# Patient Record
Sex: Female | Born: 1950 | Race: White | Hispanic: No | Smoking: Never smoker
Health system: Southern US, Community
[De-identification: ages and names within clinical notes are randomized; demographics above are authoritative.]

## PROBLEM LIST (undated history)

## (undated) DIAGNOSIS — F40240 Claustrophobia: Secondary | ICD-10-CM

## (undated) DIAGNOSIS — K5792 Diverticulitis of intestine, part unspecified, without perforation or abscess without bleeding: Secondary | ICD-10-CM

## (undated) DIAGNOSIS — F99 Mental disorder, not otherwise specified: Secondary | ICD-10-CM

## (undated) DIAGNOSIS — F319 Bipolar disorder, unspecified: Secondary | ICD-10-CM

## (undated) DIAGNOSIS — K589 Irritable bowel syndrome without diarrhea: Secondary | ICD-10-CM

## (undated) DIAGNOSIS — R569 Unspecified convulsions: Secondary | ICD-10-CM

## (undated) DIAGNOSIS — G2581 Restless legs syndrome: Secondary | ICD-10-CM

## (undated) DIAGNOSIS — K5900 Constipation, unspecified: Secondary | ICD-10-CM

## (undated) DIAGNOSIS — F419 Anxiety disorder, unspecified: Secondary | ICD-10-CM

## (undated) DIAGNOSIS — M4727 Other spondylosis with radiculopathy, lumbosacral region: Secondary | ICD-10-CM

## (undated) DIAGNOSIS — R251 Tremor, unspecified: Secondary | ICD-10-CM

## (undated) DIAGNOSIS — R42 Dizziness and giddiness: Secondary | ICD-10-CM

## (undated) DIAGNOSIS — I10 Essential (primary) hypertension: Secondary | ICD-10-CM

## (undated) DIAGNOSIS — G43019 Migraine without aura, intractable, without status migrainosus: Secondary | ICD-10-CM

## (undated) DIAGNOSIS — K760 Fatty (change of) liver, not elsewhere classified: Secondary | ICD-10-CM

## (undated) DIAGNOSIS — K219 Gastro-esophageal reflux disease without esophagitis: Secondary | ICD-10-CM

## (undated) DIAGNOSIS — F32A Depression, unspecified: Secondary | ICD-10-CM

## (undated) DIAGNOSIS — R51 Headache: Secondary | ICD-10-CM

## (undated) DIAGNOSIS — G250 Essential tremor: Secondary | ICD-10-CM

## (undated) DIAGNOSIS — H532 Diplopia: Principal | ICD-10-CM

## (undated) DIAGNOSIS — R269 Unspecified abnormalities of gait and mobility: Secondary | ICD-10-CM

## (undated) DIAGNOSIS — F329 Major depressive disorder, single episode, unspecified: Secondary | ICD-10-CM

## (undated) DIAGNOSIS — M1990 Unspecified osteoarthritis, unspecified site: Secondary | ICD-10-CM

## (undated) HISTORY — PX: BLADDER SUSPENSION: SHX72

## (undated) HISTORY — DX: Diplopia: H53.2

## (undated) HISTORY — DX: Essential tremor: G25.0

## (undated) HISTORY — PX: JOINT REPLACEMENT: SHX530

## (undated) HISTORY — DX: Unspecified abnormalities of gait and mobility: R26.9

## (undated) HISTORY — PX: ABDOMINAL HYSTERECTOMY: SHX81

## (undated) HISTORY — PX: EYE SURGERY: SHX253

## (undated) HISTORY — DX: Migraine without aura, intractable, without status migrainosus: G43.019

## (undated) HISTORY — PX: COLONOSCOPY W/ POLYPECTOMY: SHX1380

---

## 2005-01-21 ENCOUNTER — Encounter: Payer: Self-pay | Admitting: Internal Medicine

## 2005-05-18 ENCOUNTER — Emergency Department (HOSPITAL_COMMUNITY): Admission: EM | Admit: 2005-05-18 | Discharge: 2005-05-18 | Payer: Self-pay | Admitting: Emergency Medicine

## 2005-05-20 ENCOUNTER — Emergency Department (HOSPITAL_COMMUNITY): Admission: EM | Admit: 2005-05-20 | Discharge: 2005-05-20 | Payer: Self-pay | Admitting: Family Medicine

## 2005-05-23 ENCOUNTER — Ambulatory Visit: Payer: Self-pay | Admitting: Internal Medicine

## 2005-05-24 ENCOUNTER — Other Ambulatory Visit (HOSPITAL_COMMUNITY): Admission: RE | Admit: 2005-05-24 | Discharge: 2005-06-14 | Payer: Self-pay | Admitting: Psychiatry

## 2005-05-24 ENCOUNTER — Ambulatory Visit: Payer: Self-pay | Admitting: Psychiatry

## 2005-05-30 ENCOUNTER — Ambulatory Visit: Payer: Self-pay | Admitting: Internal Medicine

## 2005-08-04 ENCOUNTER — Ambulatory Visit: Payer: Self-pay | Admitting: Internal Medicine

## 2005-08-10 ENCOUNTER — Ambulatory Visit: Payer: Self-pay | Admitting: Internal Medicine

## 2005-08-23 ENCOUNTER — Ambulatory Visit: Payer: Self-pay | Admitting: Internal Medicine

## 2005-08-23 ENCOUNTER — Encounter: Payer: Self-pay | Admitting: Internal Medicine

## 2005-08-25 ENCOUNTER — Ambulatory Visit: Payer: Self-pay

## 2005-08-25 ENCOUNTER — Encounter: Payer: Self-pay | Admitting: Cardiology

## 2005-09-02 ENCOUNTER — Emergency Department (HOSPITAL_COMMUNITY): Admission: EM | Admit: 2005-09-02 | Discharge: 2005-09-02 | Payer: Self-pay | Admitting: Emergency Medicine

## 2005-09-28 ENCOUNTER — Ambulatory Visit: Payer: Self-pay | Admitting: Internal Medicine

## 2005-09-30 ENCOUNTER — Ambulatory Visit: Payer: Self-pay | Admitting: Internal Medicine

## 2006-10-11 ENCOUNTER — Encounter: Payer: Self-pay | Admitting: Internal Medicine

## 2006-10-11 DIAGNOSIS — E785 Hyperlipidemia, unspecified: Secondary | ICD-10-CM

## 2006-10-11 DIAGNOSIS — Z8719 Personal history of other diseases of the digestive system: Secondary | ICD-10-CM

## 2006-10-11 DIAGNOSIS — K589 Irritable bowel syndrome without diarrhea: Secondary | ICD-10-CM

## 2006-10-11 DIAGNOSIS — F329 Major depressive disorder, single episode, unspecified: Secondary | ICD-10-CM

## 2007-02-22 HISTORY — PX: BACK SURGERY: SHX140

## 2009-01-13 ENCOUNTER — Encounter: Payer: Self-pay | Admitting: Internal Medicine

## 2009-02-03 ENCOUNTER — Telehealth: Payer: Self-pay | Admitting: Internal Medicine

## 2009-03-26 ENCOUNTER — Encounter (INDEPENDENT_AMBULATORY_CARE_PROVIDER_SITE_OTHER): Payer: Self-pay | Admitting: Neurosurgery

## 2009-03-26 ENCOUNTER — Ambulatory Visit (HOSPITAL_COMMUNITY): Admission: RE | Admit: 2009-03-26 | Discharge: 2009-03-27 | Payer: Self-pay | Admitting: Neurosurgery

## 2009-06-05 ENCOUNTER — Encounter (INDEPENDENT_AMBULATORY_CARE_PROVIDER_SITE_OTHER): Payer: Self-pay | Admitting: *Deleted

## 2009-06-14 ENCOUNTER — Emergency Department (HOSPITAL_COMMUNITY): Admission: EM | Admit: 2009-06-14 | Discharge: 2009-06-14 | Payer: Self-pay | Admitting: Emergency Medicine

## 2009-07-23 DIAGNOSIS — K219 Gastro-esophageal reflux disease without esophagitis: Secondary | ICD-10-CM | POA: Insufficient documentation

## 2009-07-23 DIAGNOSIS — K5730 Diverticulosis of large intestine without perforation or abscess without bleeding: Secondary | ICD-10-CM | POA: Insufficient documentation

## 2009-07-23 DIAGNOSIS — F411 Generalized anxiety disorder: Secondary | ICD-10-CM | POA: Insufficient documentation

## 2009-07-29 ENCOUNTER — Ambulatory Visit: Payer: Self-pay | Admitting: Internal Medicine

## 2009-07-29 DIAGNOSIS — G43909 Migraine, unspecified, not intractable, without status migrainosus: Secondary | ICD-10-CM | POA: Insufficient documentation

## 2009-07-29 DIAGNOSIS — H409 Unspecified glaucoma: Secondary | ICD-10-CM | POA: Insufficient documentation

## 2009-07-29 DIAGNOSIS — R42 Dizziness and giddiness: Secondary | ICD-10-CM | POA: Insufficient documentation

## 2009-07-29 DIAGNOSIS — N390 Urinary tract infection, site not specified: Secondary | ICD-10-CM

## 2009-07-29 DIAGNOSIS — I10 Essential (primary) hypertension: Secondary | ICD-10-CM | POA: Insufficient documentation

## 2009-07-30 ENCOUNTER — Telehealth: Payer: Self-pay | Admitting: Internal Medicine

## 2009-07-30 DIAGNOSIS — R142 Eructation: Secondary | ICD-10-CM

## 2009-07-30 DIAGNOSIS — R143 Flatulence: Secondary | ICD-10-CM

## 2009-07-30 DIAGNOSIS — R141 Gas pain: Secondary | ICD-10-CM | POA: Insufficient documentation

## 2009-08-05 ENCOUNTER — Telehealth: Payer: Self-pay | Admitting: Internal Medicine

## 2009-10-16 ENCOUNTER — Telehealth: Payer: Self-pay | Admitting: Internal Medicine

## 2010-03-14 ENCOUNTER — Encounter: Payer: Self-pay | Admitting: Internal Medicine

## 2010-03-23 NOTE — Assessment & Plan Note (Signed)
Summary: Gastroenterology  Robin Collier MR#:  [id number] Page #  NAME:  Robin Collier, Robin Collier  OFFICE NO:  [id number]  DATE:  05/23/05  DOB:  05/03/2050  HISTORY OF PRESENT ILLNESS:  The patient is a 60 year old white female who is here today for evaluation of gastroesophageal reflux disease with dysphagia to solids and liquids and also for a problem of constipation and left lower quadrant abdominal pain.  We saw the patient in 1997 for change in the bowel habits, occasional rectal bleeding.  Her colonoscopy was normal except for mild diverticulosis of the left colon.  She also had an upper endoscopy because of right upper quadrant abdominal pain.  The exam was normal, ultrasound of the gallbladder was normal.  She has had severe chronic depression for the past 10 years and has been on multiple medications.  She moved away to [place] where she was cared for by a gastroenterologist who told her that she may need anti-reflux procedure.  She has dysphagia to pills as well as to liquids and solids.  She has no odynophagia.  Her mouth has been dry.  Last endoscopy was approximately 1 year ago in [place].  We do not have those records.  She also had a colonoscopy since then and was told that she needed a repeat exam last fall.   MEDICATIONS:  Trazodone 100 mg q. HS, ______ q.d., Cymbalta 60 mg q.d, Aciphex 20 mg Collier.o. q.d., ranitidine 300 mg q. HS, Zelnorm 6 mg Collier.o. b.i.d., Lamictal 200 mg Collier.o. q.d., Wellbutrin XL 150 mg Collier.o. q.d., and Nasacort.  PAST HISTORY:  Significant for high cholesterol, anxiety, depression.     OPERATIONS:  Hysterectomy, back surgery.    FAMILY HISTORY:  Father had diabetes.  Mother, ovarian cancer.  Father, heart disease.    SOCIAL HISTORY:  She is single.  She worked for [employer], retired due to disability.  She has 2 children.  She does not smoke, does not drink.  REVIEW OF SYSTEMS:  Positive for eyeglasses, frequent cough, skin rashes, excessive thirst, dry mouth,  night sweats, vision changes, back pain, severe fatigue, shortness of breath, and confusion.  PHYSICAL EXAMINATION:  Blood pressure 124/62.  Pulse 78.  Weight 178 pounds.  Patient was very quiet and pleasant but completely flat affect.  She could hardly talk.  Sclerae is nonicteric.  Oral cavity was normal.  Neck was supple without adenopathy.  Lungs were clear to auscultation.  COR with normal S1, normal S2.  Abdomen was soft, relaxed, nontender with normal active bowel sounds. Normal epigastrium.  Liver edge at costal margin.  Rectal exam with normal rectal tone, stool was hemoccult negative. Extremities:  No edema.  IMPRESSION:   2.   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  She has a history of gastroesophageal reflux and it is possible that she may have developed esophageal stricture although on my last endoscopy 10 years ago there was no stricture.  I would tend to think that her swallowing problems are related to motility rather than to structural obstruction.   2.   Left lower quadrant abdominal pain most likely related to symptomatic diverticulosis or due to functional constipation or due to irritable bowel syndrome.  This needs to be further investigated since she is over 62 years old.   There is no occult gastrointestinal blood loss.    PLAN: 1.  Barium swallow with cine-esophagram to assess the motility. 2.   Continue Aciphex and ranitidine. 3.   Discuss with her psychiatrist reduction of some of her psychotropic medications to improve her swallowing problem. 4.   Colonoscopy scheduled using routine colonoscopy prep.  Depending on the findings, she may need anti-spasmodic so increase fiber in her diet.         Hedwig Morton. Juanda Chance, M.D.  [id number] cc:  Dr. Byrd Hesselbach, [facility] D:  05/23/05; T:   ; Job [phone]

## 2010-03-23 NOTE — Letter (Signed)
Summary: New Patient letter  [facility]  [address] [place], [place] [address]   Phone: [phone]  Fax: [phone]       06/05/2009 MRN: [id number]  Robin Collier [address] [place], [place]  [address]  Dear Ms. Denis,  Welcome to the Gastroenterology Division at [facility].    You are scheduled to see Dr.  Juanda Chance on 07-29-09 at 10:30a.m. on the 3rd floor at [facility], [address]  We ask that you try to arrive at our office 15 minutes prior to your appointment time to allow for check-in.  We would like you to complete the enclosed self-administered evaluation form prior to your visit and bring it with you on the day of your appointment.  We will review it with you.  Also, please bring a complete list of all your medications or, if you prefer, bring the medication bottles and we will list them.  Please bring your insurance card so that we may make a copy of it.  If your insurance requires a referral to see a specialist, please bring your referral form from your primary care physician.  Co-payments are due at the time of your visit and may be paid by cash, check or credit card.     Your office visit will consist of a consult with your physician (includes a physical exam), any laboratory testing he/she may order, scheduling of any necessary diagnostic testing (e.g. x-ray, ultrasound, CT-scan), and scheduling of a procedure (e.g. Endoscopy, Colonoscopy) if required.  Please allow enough time on your schedule to allow for any/all of these possibilities.    If you cannot keep your appointment, please call [phone] to cancel or reschedule prior to your appointment date.  This allows [name] the opportunity to schedule an appointment for another patient in need of care.  If you do not cancel or reschedule by 5 p.m. the business day prior to your appointment date, you will be charged a $50.00 late cancellation/no-show fee.    Thank you for  choosing [facility] for your medical needs.  We appreciate the opportunity to care for you.  Please visit [name] at our website  to learn more about our practice.                     Sincerely,                                                             The Gastroenterology Division

## 2010-03-23 NOTE — Progress Notes (Signed)
Summary: Discuss a gi problem   Phone Note Call from Patient Call back at [phone]   Call For: Dr Juanda Chance Reason for Call: Talk to Nurse Summary of Call: Wants to discuss a previous gi problems she had. Initial call taken by: Leanor Kail [facility],  October 16, 2009 11:01 AM  Follow-up for Phone Call        Patient  c/o right side pain, that radiates around to her back an then down to her feet.  Patient  denies any other complaints.  Patient  advised she should check with her orthopedic or her physician that sees her for her back problems, if they feel it is not related to her back issues give [name] a call back. Follow-up by: Darcey Nora RN, CGRN,  October 16, 2009 11:22 AM

## 2010-03-23 NOTE — Progress Notes (Signed)
Summary: Triage  Medications Added XIFAXAN 550 MG TABS (RIFAXIMIN) Take 1 by mouth twice daily for 10 days.       Phone Note Call from Patient Call back at Home Phone [phone]   Caller: Patient Call For: Dr. Juanda Chance Reason for Call: Talk to Nurse Summary of Call: pt is still having pain in her lower right side.  Initial call taken by: Karna Christmas,  August 05, 2009 2:49 PM  Follow-up for Phone Call        Last OV 07-29-09.  Pt. continues with RLQ pain, nausea and intermittent fever.  Denies constipation, diarrhea, blood.   DR.BRODIEPLEASE ADVISE  Follow-up by: Laureen Ochs LPN,  August 05, 2009 3:27 PM  Additional Follow-up for Phone Call Additional follow up Details #1::        Please try Xifaxan 550mg , 1 by mouth two times a day x 10 days, if too expensive, then  try Amitiza by mouth two times a day,#60, 1 refill. But try the Xifaxan first. Additional Follow-up by: Hart Carwin MD,  August 06, 2009 1:17 PM    Additional Follow-up for Phone Call Additional follow up Details #2::    Message left for patient to callback. Laureen Ochs LPN  August 06, 2009 2:01 PM   Above MD orders reviewed with patient. Pt. instructed to call back as needed.  Follow-up by: Laureen Ochs LPN,  August 06, 2009 3:30 PM  New/Updated Medications: XIFAXAN 550 MG TABS (RIFAXIMIN) Take 1 by mouth twice daily for 10 days. Prescriptions: XIFAXAN 550 MG TABS (RIFAXIMIN) Take 1 by mouth twice daily for 10 days.  #20 x 0   Entered by:   Laureen Ochs LPN   Authorized by:   Hart Carwin MD   Signed by:   Laureen Ochs LPN on 09/81/1914   Method used:   Electronically to        [facility]  [address] [id number] (retail)       [address]       [place], [place]  [address]       Ph: [phone] or [phone]       Fax: [phone]   RxID:   [id number]

## 2010-03-23 NOTE — Progress Notes (Signed)
Summary: Ultrasound cancelled   Phone Note Call from Patient Call back at Home Phone [phone]   Caller: Patient Call For: Dr. Juanda Chance Reason for Call: Talk to Nurse Summary of Call: pt is sch'd for ultrasound on Mon.--her PCP had her do one in Nov. 2010. Wants to know if she should keep appt. Initial call taken by: Karna Christmas,  July 30, 2009 4:08 PM  Follow-up for Phone Call        Pt. had the ultrasound done in November at [facility].  The report is on Dr.Jlee Harkless's desk for her review. I will call pt. when Dr.Talulah Schirmer advises. Follow-up by: Laureen Ochs LPN,  July 31, 2009 12:07 PM  Additional Follow-up for Phone Call Additional follow up Details #1::        Per Dr.Hilda Wexler-Ultrasound has been cancelled. Dr.Usbaldo Pannone will review pt. chart and advise on follow-up. Pt. instructed to call back as needed.  Additional Follow-up by: Laureen Ochs LPN,  July 31, 2009 12:25 PM    Additional Follow-up for Phone Call Additional follow up Details #2::    Ultrasound reviewed. It is normal. Follow-up by: Hart Carwin MD,  July 31, 2009 1:16 PM

## 2010-03-23 NOTE — Assessment & Plan Note (Signed)
Summary: f/u for IBS--ch.    History of Present Illness Visit Type: new patient  Primary GI MD: Lina Sar MD Primary Provider: Alinda Deem, MD  Requesting Provider: na Chief Complaint: IBS, diarrhea constipation, dysphagia, bloating, and GERD History of Present Illness:   This is a 60 year old female with irritable bowel syndrome who is on multiple psychotropic medications which include trazodone, clonazepam, Lamictal, and Zoloft for depression. She is complaining of bloating, abdominal tenderness and discomfort. She denies rectal bleeding. Her last office visit with [name] was in April 2007. Her colonoscopy in July 2007 showed mild diverticulosis of the left colon. Random biopsies of the colon showed normal mucosa. Her discomfort is mostly in the lower abdomen. She has glaucoma and therefore cannot take antispasmodics.   GI Review of Systems    Reports abdominal pain, acid reflux, belching, bloating, and  heartburn.     Location of  Abdominal pain: right side.    Denies vomiting blood.      Reports constipation, diarrhea, diverticulosis, hemorrhoids, and  irritable bowel syndrome.     Denies anal fissure, black tarry stools, change in bowel habit, fecal incontinence, heme positive stool, jaundice, light color stool, liver problems, rectal bleeding, and  rectal pain.    Current Medications (verified): 1)  Trazodone Hcl 100 Mg  Tabs (Trazodone Hcl) .... Two Tablets By Mouth At Bedtime 2)  Clonazepam 0.5 Mg  Tabs (Clonazepam) .... Once Daily 3)  Prilosec Otc 20 Mg Tbec (Omeprazole Magnesium) .... One Tablet By Mouth At Bedtime 4)  Estrace 0.5 Mg Tabs (Estradiol) .... One Tablet By Mouth Once Daily 5)  Lamictal 200 Mg  Tabs (Lamotrigine) .... 1/2 Tablet By Mouth in The Morning and One Tablet By Mouth At Night 6)  Wellbutrin Xl 300 Mg Xr24h-Tab (Bupropion Hcl) .... One Tablet By Mouth Once Daily in The Morning 7)  Nasacort Aq 55 Mcg/act  Aers (Triamcinolone Acetonide(Nasal)) .... At  Bedtime 8)  Lumigan 0.03 % Soln (Bimatoprost) .... As Directed 9)  Meclizine Hcl 25 Mg Tabs (Meclizine Hcl) .... As Needed During The Day 10)  Pravastatin Sodium 40 Mg Tabs (Pravastatin Sodium) .... One Tablet By Mouth Once Daily 11)  Inderal(Dosage Unknown) .... 1/2 Tablet By Mouth At Bedtime 12)  Zoloft 100 Mg Tabs (Sertraline Hcl) .... One Tablet By Mouth in The Morning and One Tablet By Mouth At Night 13)  Keppra 250 Mg Tabs (Levetiracetam) .... One Tablet By Mouth Once Daily  Allergies (verified): No Known Drug Allergies  Past History:  Past Medical History: Reviewed history from 10/11/2006 and no changes required. Depression Diverticulitis, hx of Hyperlipidemia  Past Surgical History: Reviewed history from 07/23/2009 and no changes required. Hysterectomy Back Surgery Tonsillectomy  Family History: Reviewed history from 07/23/2009 and no changes required. Family History of Diabetes: Father, Paternal Grandmother Family History of Heart Disease: Paternal Grandmother, Father Family History of Breast Cancer:Maternal Grandmother Family History of Ovarian Cancer: Mother No FH of Colon Cancer:  Social History: Occupation: Retired Divorced Childern Alcohol Use - no Illicit Drug Use - no Patient has never smoked.  Daily Caffeine Use: 1-2 daily   Review of Systems       The patient complains of anxiety-new, back pain, change in vision, confusion, depression-new, fatigue, headaches-new, night sweats, thirst - excessive, and urination - excessive.  The patient denies allergy/sinus, anemia, arthritis/joint pain, blood in urine, breast changes/lumps, cough, coughing up blood, fainting, fever, hearing problems, heart murmur, heart rhythm changes, itching, menstrual pain, muscle pains/cramps, nosebleeds,  pregnancy symptoms, shortness of breath, skin rash, sleeping problems, sore throat, swelling of feet/legs, swollen lymph glands, thirst - excessive , urination - excessive ,  urination changes/pain, urine leakage, vision changes, and voice change.         Pertinent positive and negative review of systems were noted in the above HPI. All other ROS was otherwise negative.   Vital Signs:  Patient profile:   60 year old female Height:      64 inches Weight:      181 pounds BMI:     31.18 BSA:     1.88 Pulse rate:   72 / minute Pulse rhythm:   regular BP sitting:   110 / 64  (left arm) Cuff size:   regular  Vitals Entered By: Ok Anis CMA (July 29, 2009 10:46 AM)  Physical Exam  General:  she moves very slowly and is mentally very slow although alert and oriented. Neck:  Supple; no masses or thyromegaly. Lungs:  Clear throughout to auscultation. Heart:  Regular rate and rhythm; no murmurs, rubs,  or bruits. Abdomen:  soft, nontender abdomen with normoactive bowel sounds. No distention. No palpable mass. Most of the discomfort is left lower quadrant and right lower quadrant. There is no hernia and no scars. Rectal:  soft Hemoccult negative stool. Extremities:  No clubbing, cyanosis, edema or deformities noted. Psych:  patient appears overmedicated   Impression & Recommendations:  Problem # 1:  IBS (ICD-564.1) Patient has typical symptoms of irritable bowel syndrome consisting of bloating and abdominal discomfort. She is being treated for depression and anxiety with multiple psychotropic medications which may affect her bowel motility. She is unable to take anti-cholinergic medications due to her glaucoma. I have given her samples of a probiotic to take one a day and samples of AcipHex which she prefers instead of taking Prilosec. She is up-to-date on her colonoscopy. Her last gallbladder ultrasound in 1997 was normal. We will repeat the ultrasound.  Patient Instructions: 1)  Take Align 1 capsule by mouth once daily. We have given you samples to take. 2)  We have given you samples of Aciphex to try 3)  Upper abdominal ultrasound.Marland Kitchen 4)  Copy sent to :  Alinda Deem, MD  5)  The medication list was reviewed and reconciled.  All changed / newly prescribed medications were explained.  A complete medication list was provided to the patient / caregiver.  Appended Document: Orders Update    Clinical Lists Changes  Problems: Added new problem of ABDOMINAL BLOATING (ICD-787.3) Orders: Added new Test order of Ultrasound Abdomen (UAS) - Signed

## 2010-05-12 LAB — CBC
Hemoglobin: 11.9 g/dL — ABNORMAL LOW (ref 12.0–15.0)
MCHC: 33.2 g/dL (ref 30.0–36.0)
Platelets: 377 10*3/uL (ref 150–400)
RDW: 16.5 % — ABNORMAL HIGH (ref 11.5–15.5)

## 2010-05-12 LAB — BASIC METABOLIC PANEL
CO2: 27 mEq/L (ref 19–32)
Calcium: 8.8 mg/dL (ref 8.4–10.5)
Creatinine, Ser: 0.83 mg/dL (ref 0.4–1.2)
Glucose, Bld: 109 mg/dL — ABNORMAL HIGH (ref 70–99)
Sodium: 140 mEq/L (ref 135–145)

## 2010-05-12 LAB — URINE CULTURE

## 2010-05-12 LAB — URINALYSIS, ROUTINE W REFLEX MICROSCOPIC
Ketones, ur: NEGATIVE mg/dL
Leukocytes, UA: NEGATIVE
Nitrite: NEGATIVE
Urobilinogen, UA: 0.2 mg/dL (ref 0.0–1.0)
pH: 6 (ref 5.0–8.0)

## 2010-05-12 LAB — URINE MICROSCOPIC-ADD ON

## 2010-05-12 LAB — APTT: aPTT: 25 seconds (ref 24–37)

## 2010-05-12 LAB — PROTIME-INR: Prothrombin Time: 12.6 seconds (ref 11.6–15.2)

## 2011-04-15 ENCOUNTER — Ambulatory Visit (HOSPITAL_COMMUNITY)
Admission: RE | Admit: 2011-04-15 | Discharge: 2011-04-15 | Disposition: A | Discharge status: Home / Self Care | Payer: Medicare Other | Attending: Psychiatry | Admitting: Psychiatry

## 2011-04-15 ENCOUNTER — Emergency Department (HOSPITAL_COMMUNITY)
Admission: EM | Admit: 2011-04-15 | Discharge: 2011-04-18 | Disposition: A | Discharge status: Home / Self Care | Payer: Medicare Other | Attending: Psychiatry | Admitting: Psychiatry

## 2011-04-15 ENCOUNTER — Encounter (HOSPITAL_COMMUNITY): Payer: Self-pay | Admitting: Licensed Clinical Social Worker

## 2011-04-15 ENCOUNTER — Other Ambulatory Visit: Payer: Self-pay

## 2011-04-15 ENCOUNTER — Emergency Department (HOSPITAL_COMMUNITY): Payer: Medicare Other

## 2011-04-15 ENCOUNTER — Encounter (HOSPITAL_COMMUNITY): Payer: Self-pay | Admitting: *Deleted

## 2011-04-15 DIAGNOSIS — F332 Major depressive disorder, recurrent severe without psychotic features: Secondary | ICD-10-CM | POA: Insufficient documentation

## 2011-04-15 DIAGNOSIS — F419 Anxiety disorder, unspecified: Secondary | ICD-10-CM

## 2011-04-15 DIAGNOSIS — F3289 Other specified depressive episodes: Secondary | ICD-10-CM | POA: Insufficient documentation

## 2011-04-15 DIAGNOSIS — R279 Unspecified lack of coordination: Secondary | ICD-10-CM | POA: Insufficient documentation

## 2011-04-15 DIAGNOSIS — R4182 Altered mental status, unspecified: Secondary | ICD-10-CM | POA: Insufficient documentation

## 2011-04-15 DIAGNOSIS — F329 Major depressive disorder, single episode, unspecified: Secondary | ICD-10-CM | POA: Insufficient documentation

## 2011-04-15 DIAGNOSIS — F411 Generalized anxiety disorder: Secondary | ICD-10-CM | POA: Insufficient documentation

## 2011-04-15 HISTORY — DX: Depression, unspecified: F32.A

## 2011-04-15 HISTORY — DX: Mental disorder, not otherwise specified: F99

## 2011-04-15 HISTORY — DX: Essential (primary) hypertension: I10

## 2011-04-15 HISTORY — DX: Major depressive disorder, single episode, unspecified: F32.9

## 2011-04-15 HISTORY — DX: Headache: R51

## 2011-04-15 HISTORY — DX: Unspecified convulsions: R56.9

## 2011-04-15 HISTORY — DX: Anxiety disorder, unspecified: F41.9

## 2011-04-15 LAB — COMPREHENSIVE METABOLIC PANEL
ALT: 15 U/L (ref 0–35)
AST: 19 U/L (ref 0–37)
Albumin: 4 g/dL (ref 3.5–5.2)
Alkaline Phosphatase: 75 U/L (ref 39–117)
BUN: 7 mg/dL (ref 6–23)
Calcium: 9.2 mg/dL (ref 8.4–10.5)
Creatinine, Ser: 0.98 mg/dL (ref 0.50–1.10)
GFR calc non Af Amer: 61 mL/min — ABNORMAL LOW (ref >90)
Potassium: 3.2 mEq/L — ABNORMAL LOW (ref 3.5–5.1)
Total Protein: 6.9 g/dL (ref 6.0–8.3)

## 2011-04-15 LAB — CBC
MCH: 28.2 pg (ref 26.0–34.0)
MCHC: 32.8 g/dL (ref 30.0–36.0)
MCV: 85.8 fL (ref 78.0–100.0)
RBC: 4.44 MIL/uL (ref 3.87–5.11)
WBC: 7.5 10*3/uL (ref 4.0–10.5)

## 2011-04-15 LAB — URINE MICROSCOPIC-ADD ON

## 2011-04-15 LAB — DIFFERENTIAL
Basophils Absolute: 0 10*3/uL (ref 0.0–0.1)
Basophils Relative: 0 % (ref 0–1)
Lymphocytes Relative: 34 % (ref 12–46)
Monocytes Absolute: 0.6 10*3/uL (ref 0.1–1.0)
Neutro Abs: 4.1 10*3/uL (ref 1.7–7.7)

## 2011-04-15 LAB — VALPROIC ACID LEVEL: Valproic Acid Lvl: <10 ug/mL — ABNORMAL LOW (ref 50.0–100.0)

## 2011-04-15 LAB — URINALYSIS, ROUTINE W REFLEX MICROSCOPIC
Glucose, UA: NEGATIVE mg/dL
Leukocytes, UA: NEGATIVE
Specific Gravity, Urine: 1.016 (ref 1.005–1.030)
pH: 6 (ref 5.0–8.0)

## 2011-04-15 LAB — RAPID URINE DRUG SCREEN, HOSP PERFORMED
Amphetamines: NOT DETECTED
Barbiturates: NOT DETECTED
Tetrahydrocannabinol: NOT DETECTED

## 2011-04-15 MED ORDER — SODIUM CHLORIDE 0.9 % IV SOLN
Freq: Once | INTRAVENOUS | Status: AC
  Administered 2011-04-15: 18:00:00 via INTRAVENOUS

## 2011-04-15 MED ORDER — POTASSIUM CHLORIDE CRYS ER 20 MEQ PO TBCR
40.0000 meq | EXTENDED_RELEASE_TABLET | Freq: Once | ORAL | Status: AC
  Administered 2011-04-15: 40 meq via ORAL
  Filled 2011-04-15: qty 2

## 2011-04-15 MED ORDER — LORAZEPAM 1 MG PO TABS
1.0000 mg | ORAL_TABLET | Freq: Once | ORAL | Status: AC
  Administered 2011-04-15: 1 mg via ORAL

## 2011-04-15 MED ORDER — ACETAMINOPHEN 325 MG PO TABS: 650.0000 mg | ORAL_TABLET | ORAL | Status: DC | PRN

## 2011-04-15 MED ORDER — ALUM & MAG HYDROXIDE-SIMETH 200-200-20 MG/5ML PO SUSP
30.0000 mL | ORAL | Status: DC | PRN
  Administered 2011-04-16: 30 mL via ORAL
  Filled 2011-04-15: qty 30

## 2011-04-15 MED ORDER — LORAZEPAM 1 MG PO TABS
1.0000 mg | ORAL_TABLET | Freq: Three times a day (TID) | ORAL | Status: DC | PRN
  Administered 2011-04-15 – 2011-04-17 (×3): 1 mg via ORAL
  Filled 2011-04-15 (×4): qty 1

## 2011-04-15 MED ORDER — NICOTINE 21 MG/24HR TD PT24: 21.0000 mg | MEDICATED_PATCH | Freq: Every day | TRANSDERMAL | Status: DC

## 2011-04-15 MED ORDER — IBUPROFEN 600 MG PO TABS
600.0000 mg | ORAL_TABLET | Freq: Three times a day (TID) | ORAL | Status: DC | PRN
  Administered 2011-04-15 – 2011-04-17 (×2): 600 mg via ORAL
  Filled 2011-04-15 (×2): qty 3

## 2011-04-15 MED ORDER — ONDANSETRON HCL 4 MG PO TABS: 4.0000 mg | ORAL_TABLET | Freq: Three times a day (TID) | ORAL | Status: DC | PRN

## 2011-04-15 MED ORDER — ZOLPIDEM TARTRATE 5 MG PO TABS
5.0000 mg | ORAL_TABLET | Freq: Every evening | ORAL | Status: DC | PRN
  Administered 2011-04-15 – 2011-04-17 (×2): 5 mg via ORAL
  Filled 2011-04-15 (×2): qty 1
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
## 2011-04-15 NOTE — ED Notes (Signed)
-   SPOKE WITH DAUGHTER KIM AND ANOTHER FAMILY MEMBER ABOUT PT WANTING TO RETURN HOME.   FAMILY STATED THAT THEY WILL NOT COME AND TAKE PT HOME BECAUSE PT HAS BEEN THREATENING SUICIDE.   PT AWARE........Marland Kitchen

## 2011-04-15 NOTE — ED Notes (Signed)
Pt information has been faxed to the following places for possible disposition: [facility], [facility], [facility] and [facility].   Oncoming ACT/CSW to follow up to secure placement.

## 2011-04-15 NOTE — ED Notes (Signed)
Pt brought over from ED and placed in TCU in room 30. Pt very tearful and agitated.

## 2011-04-15 NOTE — ED Notes (Signed)
Pt in from [facility] c/o SI, depression and anxiety, sent here due to new onset seizures, pt not accepted at [facility] and states she needs to go to a geriatric unit, pt dizzy and having trouble ambulating at this time.

## 2011-04-15 NOTE — ED Provider Notes (Signed)
History     CSN: [id number]  Arrival date & time 04/15/11  1543   First MD Initiated Contact with Patient 04/15/11 1619      Chief Complaint  Patient presents with  . Medical Clearance    (Consider location/radiation/quality/duration/timing/severity/associated sxs/prior treatment) The history is provided by the patient.   Patient presents with suicidal ideations and without definitive find herself. Notes increase depression. And patient denies any ingestions at this time. No fevers or vomiting. Denies any auditory or visual hallucinations. 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 According to her, she is referred to for this problem multiple times in been seen by neurology and has also had MRIs. Patient had a seizure last night x2. He was seen at [facility], had a head CT" her that was negative and she was discharged home with medications. [facility] and will was sent here for placement to a geriatric psychiatric facility. Patient denies any falls recently. Patient is unsure of when she had a seizure prior to last night Past Medical History  Diagnosis Date  . Seizures   . Mental disorder   . Anxiety   . Headache   . Hypertension   . Depression     Past Surgical History  Procedure Date  . Back surgery     History reviewed. No pertinent family history.  History  Substance Use Topics  . Smoking status: Never Smoker   . Smokeless tobacco: Not on file  . Alcohol Use: No    OB History    Grav Para Term Preterm Abortions TAB SAB Ect Mult Living                  Review of Systems  All other systems reviewed and are negative.    Allergies  Review of patient's allergies indicates no known allergies.  Home Medications  No current outpatient prescriptions on file.  There were no vitals taken for this visit.  Physical Exam  Nursing note and vitals reviewed. Constitutional:  She is oriented to person, place, and time. She appears well-developed and well-nourished.  Non-toxic appearance. No distress.  HENT:  Head: Normocephalic and atraumatic.  Eyes: Conjunctivae, EOM and lids are normal. Pupils are equal, round, and reactive to light.  Neck: Normal range of motion. Neck supple. No tracheal deviation present. No mass present.  Cardiovascular: Normal rate, regular rhythm and normal heart sounds.  Exam reveals no gallop.   No murmur heard. Pulmonary/Chest: Effort normal and breath sounds normal. No stridor. No respiratory distress. She has no decreased breath sounds. She has no wheezes. She has no rhonchi. She has no rales.  Abdominal: Soft. Normal appearance and bowel sounds are normal. She exhibits no distension. There is no tenderness. There is no rebound and no CVA tenderness.  Musculoskeletal: Normal range of motion. She exhibits no edema and no tenderness.  Neurological: She is alert and oriented to person, place, and time. She has normal strength. No cranial nerve deficit or sensory deficit. Coordination abnormal. GCS eye subscore is 4. GCS verbal subscore is 5. GCS motor subscore is 6.  Skin: Skin is warm and dry. No abrasion and no rash noted.  Psychiatric: Her speech is normal. She is not actively hallucinating. Thought content is not paranoid. She exhibits a depressed mood. She expresses suicidal ideation. She expresses no homicidal ideation.    ED Course  Procedures (including critical care time)  Labs Reviewed  URINE RAPID DRUG SCREEN (HOSP PERFORMED) - Abnormal; Notable for the following:    Opiates POSITIVE (*)    All other components within normal limits  CBC  COMPREHENSIVE METABOLIC PANEL  ETHANOL  URINALYSIS, ROUTINE W REFLEX MICROSCOPIC  DIFFERENTIAL  URINE CULTURE   No results found.   No diagnosis found.    MDM   Date: 04/15/2011  Rate: 66  Rhythm: normal sinus rhythm  QRS Axis: normal  Intervals: normal  ST/T Wave  abnormalities: nonspecific ST changes  Conduction Disutrbances:none  Narrative Interpretation:   Old EKG Reviewed: unchanged    6:41 PM Patient's old records were obtained from [facility] and patient had a work up there for her unsteady gait. That included a head CT therefore I would not repeat that here. Patient was assessed at behavior health already and I spoke with the behavior health assessment team and they will not attempt to place her she is now medically cleared      Toy Baker, MD 04/15/11 1842

## 2011-04-15 NOTE — ED Notes (Signed)
Pt in bed sleeping at this time

## 2011-04-16 MED ORDER — BIMATOPROST 0.01 % OP SOLN
1.0000 [drp] | Freq: Every day | OPHTHALMIC | Status: DC
  Administered 2011-04-16 – 2011-04-17 (×2): 1 [drp] via OPHTHALMIC
  Filled 2011-04-16 (×2): qty 2.5

## 2011-04-16 MED ORDER — TRAZODONE HCL 100 MG PO TABS
100.0000 mg | ORAL_TABLET | Freq: Every day | ORAL | Status: DC
  Administered 2011-04-16 – 2011-04-17 (×2): 100 mg via ORAL
  Filled 2011-04-16 (×2): qty 1

## 2011-04-16 MED ORDER — SERTRALINE HCL 50 MG PO TABS
100.0000 mg | ORAL_TABLET | Freq: Every day | ORAL | Status: DC
  Administered 2011-04-16 – 2011-04-18 (×3): 100 mg via ORAL
  Filled 2011-04-16 (×3): qty 2

## 2011-04-16 MED ORDER — LAMOTRIGINE 200 MG PO TABS
200.0000 mg | ORAL_TABLET | Freq: Every day | ORAL | Status: DC
  Administered 2011-04-16 – 2011-04-18 (×3): 200 mg via ORAL
  Filled 2011-04-16 (×3): qty 1

## 2011-04-16 MED ORDER — BUPROPION HCL ER (XL) 300 MG PO TB24
300.0000 mg | ORAL_TABLET | Freq: Every day | ORAL | Status: DC
  Administered 2011-04-16 – 2011-04-18 (×3): 300 mg via ORAL
  Filled 2011-04-16 (×3): qty 1

## 2011-04-16 MED ORDER — CLONAZEPAM 0.5 MG PO TABS
0.5000 mg | ORAL_TABLET | Freq: Three times a day (TID) | ORAL | Status: DC | PRN
  Administered 2011-04-16 – 2011-04-17 (×3): 0.5 mg via ORAL
  Filled 2011-04-16 (×3): qty 1

## 2011-04-16 NOTE — ED Notes (Signed)
Pt belongings (2 pt belonging bags) placed in locker 30 in [place]

## 2011-04-16 NOTE — ED Notes (Signed)
Pt ate 50% of meal.

## 2011-04-16 NOTE — ED Provider Notes (Cosign Needed)
8:06 AM Chart reviewed.  Pt awake, seems somewhat depressed.  Has ataxia and ? Seizure disorder by history.  Waiting placement in geriatric psych unit. Osvaldo Human, M.D.   11:21 AM Pt's home meds ordered. Osvaldo Human, M.D.   Carleene Cooper III, MD 04/16/11 [phone]

## 2011-04-16 NOTE — ED Notes (Signed)
Pt ate 100% of meal.

## 2011-04-16 NOTE — ED Notes (Signed)
Pt in bed resting w/o any s/s of distress with sitter at bedside. Will continue to monitor.

## 2011-04-16 NOTE — ED Notes (Signed)
Report given to T. Burnetta Sabin, Charity fundraiser. Patient to transfer to room 23 .

## 2011-04-16 NOTE — ED Notes (Signed)
Pt ate 25% of meal.

## 2011-04-17 NOTE — ED Notes (Signed)
Spoke with Lanora Manis from [facility] regarding placement for patient. Faxed over face sheet; medication list; lab results at this time. No female beds available at this time, but placed on waiting list.

## 2011-04-17 NOTE — ED Notes (Signed)
Pt. Endorsed to Patina, RN with [facility] at pt.'s chart and a box of pt.'s personal medications.

## 2011-04-17 NOTE — ED Notes (Signed)
Pt. Stated she did not want to eat dinner because it was cold and she did not want it warmed.  Pt. Ate only banana pudding on tray.

## 2011-04-17 NOTE — BH Assessment (Signed)
Writer spoke w/ Dot Lanes at [facility]. Leane Call who indicated there are still no beds available and pt is on wait list. French Ana at [facility] reports they have no beds & their social worker would probably contact [facility] in the am. Per RN Amy at [facility], Sandre Kitty called and has no beds and pt still on wait list.

## 2011-04-17 NOTE — BH Assessment (Signed)
Writer spoke w/ pt at length. Pt states she is not suicidal. She reports that while she was vomiting, she had said she wished she were dead. However, pt explains that she doesn't want to kill herself and she was simply expressing how bad she felt physicially. Pt has appt at [facility] within next few weeks. Pt is future oriented. Writer spoke w/ EDP who agrees that pt can be d/c. However, pt's daughter unable to pick pt up now so pt will have to stay overnight until CSW can arrange transportation.

## 2011-04-18 LAB — URINE CULTURE

## 2011-04-18 NOTE — ED Notes (Addendum)
Son-in-law called in stating that pt called him last night stating she may be sent home. He said he spoke to the RN and the MD was supposed to call he and his wife back after speaking to pt a little more. He also said that they don't understand how it's changed from her being admitted to a psychiatric facility to being d/c home. Explained care cannot be discussed over the phone, MD will be notified and note made also. Spoke to Dr. Denton Lank who said pt is d/c home today per Telepsych evaluation. He asked that the family be told to pick pt up d/t her having had a psychiatric evaluation deeming her clear to go home, the MD and SW here when they pick her up can talk to them and give referrals for help at home. Family and SW notified. Family said they cannot pick pt up until after 6pm today d/t their work schedules. SW notified.

## 2011-04-18 NOTE — ED Provider Notes (Addendum)
Patient reported that she no longer felt suicidal at this time. I spoke with the patient about this. We she attempted to contact her family for ride home she was told that they could not calm. After discussion with both the patient's son-in-law as well as the patient there are difficulties with managing the patient home given her history of ataxic gait. Family has been trying to place the patient in assisted living. Patient also frequently will change her behavior in family is so concerned that she will go home and then suddenly become suicidal again. Repeat telepsych was ordered. If recommendation is made for discharge social work should be consulted to assist in disposition.  Repeat terlepsych has been performed. Dr. Jacky Kindle her feels that patient's behavior may be a form of malingering and attention seeking. Patient still remains to be a difficult disposition. Family will be willing to come tomorrow. Social worker will be consulted for placement first thing in the morning. Behavioral health team is aware of the change in the patient's plan.  Cyndra Numbers, MD 04/18/11 [id number]  Cyndra Numbers, MD 04/18/11 [phone]

## 2011-04-18 NOTE — ED Notes (Signed)
Discharged home to daughter w/ 2 bags of personal belongings, one with meds and one with a blanket. Both indicated understanding of recommendations to f/u w/ her psychiatrist and they have a SW working on assisted living placement already. Pt denies si/hi/avh. Eye drops from here sent w/ pt also

## 2011-04-18 NOTE — BH Assessment (Signed)
Telepsych eval completed and faxed. Dr Jacky Kindle will conduct eval.

## 2011-04-18 NOTE — ED Notes (Signed)
Pt looking forward to going home with family but she feels like she's in way with her family a little bit. Pt talked about her granddaughter and enjoying spending time with her as well as looking forward to seeing her again. Pt denies si/hi/avh.

## 2011-04-18 NOTE — ED Provider Notes (Addendum)
Pt alert, nad, vitals normal. Will discuss w sw this morning.   The psychiatrist on call/telepsych has evaluated and states cleared for discharge home. sw has discussed w family who agrees to take pt home, stay with pt.  Pt to follow up closely w pcp.   Suzi Roots, MD 04/18/11 [id number]  Suzi Roots, MD 04/18/11 [phone]

## 2011-04-18 NOTE — ED Notes (Signed)
Gaspar Skeeters, son-in-law, called back to say that they will be picking her up in the next hour and wants to speak to pt.

## 2011-04-19 NOTE — ED Notes (Signed)
+   Urine culture Patient is being followed up by [facility].

## 2014-08-04 ENCOUNTER — Emergency Department (HOSPITAL_COMMUNITY): Payer: Medicare Other

## 2014-08-04 ENCOUNTER — Encounter (HOSPITAL_COMMUNITY): Payer: Self-pay | Admitting: General Practice

## 2014-08-04 ENCOUNTER — Emergency Department (HOSPITAL_COMMUNITY)
Admission: EM | Admit: 2014-08-04 | Discharge: 2014-08-04 | Disposition: A | Discharge status: Home / Self Care | Payer: Medicare Other | Attending: Emergency Medicine | Admitting: Emergency Medicine

## 2014-08-04 DIAGNOSIS — Z79899 Other long term (current) drug therapy: Secondary | ICD-10-CM | POA: Insufficient documentation

## 2014-08-04 DIAGNOSIS — G43809 Other migraine, not intractable, without status migrainosus: Secondary | ICD-10-CM | POA: Diagnosis not present

## 2014-08-04 DIAGNOSIS — R1011 Right upper quadrant pain: Secondary | ICD-10-CM | POA: Diagnosis not present

## 2014-08-04 DIAGNOSIS — F419 Anxiety disorder, unspecified: Secondary | ICD-10-CM | POA: Diagnosis not present

## 2014-08-04 DIAGNOSIS — F329 Major depressive disorder, single episode, unspecified: Secondary | ICD-10-CM | POA: Diagnosis not present

## 2014-08-04 DIAGNOSIS — R51 Headache: Secondary | ICD-10-CM | POA: Diagnosis present

## 2014-08-04 DIAGNOSIS — G40909 Epilepsy, unspecified, not intractable, without status epilepticus: Secondary | ICD-10-CM | POA: Insufficient documentation

## 2014-08-04 DIAGNOSIS — I10 Essential (primary) hypertension: Secondary | ICD-10-CM | POA: Diagnosis not present

## 2014-08-04 MED ORDER — METOCLOPRAMIDE HCL 5 MG/ML IJ SOLN
10.0000 mg | Freq: Once | INTRAMUSCULAR | Status: AC
  Administered 2014-08-04: 10 mg via INTRAVENOUS
  Filled 2014-08-04: qty 2

## 2014-08-04 MED ORDER — NAPROXEN 500 MG PO TABS: 500.0000 mg | ORAL_TABLET | Freq: Two times a day (BID) | ORAL | Status: DC

## 2014-08-04 MED ORDER — MECLIZINE HCL 25 MG PO TABS
25.0000 mg | ORAL_TABLET | Freq: Once | ORAL | Status: AC
  Administered 2014-08-04: 25 mg via ORAL
  Filled 2014-08-04: qty 1

## 2014-08-04 MED ORDER — DIPHENHYDRAMINE HCL 50 MG/ML IJ SOLN
50.0000 mg | Freq: Once | INTRAMUSCULAR | Status: AC
  Administered 2014-08-04: 50 mg via INTRAVENOUS
  Filled 2014-08-04: qty 1

## 2014-08-04 MED ORDER — MECLIZINE HCL 12.5 MG PO TABS: 12.5000 mg | ORAL_TABLET | Freq: Three times a day (TID) | ORAL | Status: DC | PRN

## 2014-08-04 MED ORDER — METOCLOPRAMIDE HCL 10 MG PO TABS: 10.0000 mg | ORAL_TABLET | Freq: Four times a day (QID) | ORAL | Status: DC

## 2014-08-04 MED ORDER — KETOROLAC TROMETHAMINE 30 MG/ML IJ SOLN
30.0000 mg | Freq: Once | INTRAMUSCULAR | Status: AC
  Administered 2014-08-04: 30 mg via INTRAVENOUS
  Filled 2014-08-04: qty 1

## 2014-08-04 NOTE — Discharge Instructions (Signed)
You were evaluated in the ED today for your migraine headache. There is not appear to be an emergent cause her symptoms at this time. It is important to follow with primary care for further evaluation and management of your symptoms. Please take your medications as directed. Return to ED for new or worsening symptoms.  Migraine Headache A migraine headache is an intense, throbbing pain on one or both sides of your head. A migraine can last for 30 minutes to several hours. CAUSES  The exact cause of a migraine headache is not always known. However, a migraine may be caused when nerves in the brain become irritated and release chemicals that cause inflammation. This causes pain. Certain things may also trigger migraines, such as:  Alcohol.  Smoking.  Stress.  Menstruation.  Aged cheeses.  Foods or drinks that contain nitrates, glutamate, aspartame, or tyramine.  Lack of sleep.  Chocolate.  Caffeine.  Hunger.  Physical exertion.  Fatigue.  Medicines used to treat chest pain (nitroglycerine), birth control pills, estrogen, and some blood pressure medicines. SIGNS AND SYMPTOMS  Pain on one or both sides of your head.  Pulsating or throbbing pain.  Severe pain that prevents daily activities.  Pain that is aggravated by any physical activity.  Nausea, vomiting, or both.  Dizziness.  Pain with exposure to bright lights, loud noises, or activity.  General sensitivity to bright lights, loud noises, or smells. Before you get a migraine, you may get warning signs that a migraine is coming (aura). An aura may include:  Seeing flashing lights.  Seeing bright spots, halos, or zigzag lines.  Having tunnel vision or blurred vision.  Having feelings of numbness or tingling.  Having trouble talking.  Having muscle weakness. DIAGNOSIS  A migraine headache is often diagnosed based on:  Symptoms.  Physical exam.  A CT scan or MRI of your head. These imaging tests  cannot diagnose migraines, but they can help rule out other causes of headaches. TREATMENT Medicines may be given for pain and nausea. Medicines can also be given to help prevent recurrent migraines.  HOME CARE INSTRUCTIONS  Only take over-the-counter or prescription medicines for pain or discomfort as directed by your health care provider. The use of long-term narcotics is not recommended.  Lie down in a dark, quiet room when you have a migraine.  Keep a journal to find out what may trigger your migraine headaches. For example, write down:  What you eat and drink.  How much sleep you get.  Any change to your diet or medicines.  Limit alcohol consumption.  Quit smoking if you smoke.  Get 7-9 hours of sleep, or as recommended by your health care provider.  Limit stress.  Keep lights dim if bright lights bother you and make your migraines worse. SEEK IMMEDIATE MEDICAL CARE IF:   Your migraine becomes severe.  You have a fever.  You have a stiff neck.  You have vision loss.  You have muscular weakness or loss of muscle control.  You start losing your balance or have trouble walking.  You feel faint or pass out.  You have severe symptoms that are different from your first symptoms. MAKE SURE YOU:   Understand these instructions.  Will watch your condition.  Will get help right away if you are not doing well or get worse. Document Released: 02/07/2005 Document Revised: 06/24/2013 Document Reviewed: 10/15/2012 [facility] Patient Information 2015 [place], [place]. This information is not intended to replace advice given to you by your  health care provider. Make sure you discuss any questions you have with your health care provider. ° °

## 2014-08-04 NOTE — ED Provider Notes (Signed)
CSN: [id number]     Arrival date & time 08/04/14  1632 History   First MD Initiated Contact with Patient 08/04/14 1654     Chief Complaint  Patient presents with  . Migraine     (Consider location/radiation/quality/duration/timing/severity/associated sxs/prior Treatment) HPI Robin Collier is a 64 y.o. female with history of vestibular migraines comes in for evaluation of acute migraine. Patient states she "keeps a headache most all my life", but most recent headache started about 2 days ago and is typical of her typical migraine presentation. She reports her typical pain across her frontal forehead. She reports associated blurred vision, nausea without vomiting, photophobia and phonophobia. She normally takes Tylenol, but was told by her PCP not to take this anymore due to fatty liver. She has not tried anything else improve her symptoms. Loud noises and lights exacerbate her symptoms, quiet helps relieve discomfort.  Past Medical History  Diagnosis Date  . Seizures   . Mental disorder   . Anxiety   . Headache(784.0)   . Hypertension   . Depression    Past Surgical History  Procedure Laterality Date  . Back surgery     No family history on file. History  Substance Use Topics  . Smoking status: Never Smoker   . Smokeless tobacco: Not on file  . Alcohol Use: No   OB History    No data available     Review of Systems A 10 point review of systems was completed and was negative except for pertinent positives and negatives as mentioned in the history of present illness     Allergies  Seroquel  Home Medications   Prior to Admission medications   Medication Sig Start Date End Date Taking? Authorizing Provider  acetaminophen (TYLENOL) 325 MG tablet Take 650 mg by mouth every 6 (six) hours as needed for mild pain or headache.   Yes Historical Provider, MD  azelastine (OPTIVAR) 0.05 % ophthalmic solution Place 2 drops into both eyes daily as needed.  06/14/14  Yes  Historical Provider, MD  clonazePAM (KLONOPIN) 0.5 MG tablet Take 0.25-0.5 mg by mouth 2 (two) times daily. Take 0.25 mg every morning and 0.5 mg at bedtime   Yes Historical Provider, MD  dimenhyDRINATE (DRAMAMINE) 50 MG tablet Take 50 mg by mouth every 8 (eight) hours as needed for nausea or dizziness.   Yes Historical Provider, MD  lamoTRIgine (LAMICTAL) 200 MG tablet Take 200 mg by mouth 2 (two) times daily.   Yes Historical Provider, MD  polyethylene glycol (MIRALAX / GLYCOLAX) packet Take 17 g by mouth daily as needed for mild constipation.   Yes Historical Provider, MD  Probiotic Product (ALIGN) 4 MG CAPS Take 1 capsule by mouth daily.   Yes Historical Provider, MD  propranolol (INDERAL) 40 MG tablet Take 40 mg by mouth daily. 06/03/14  Yes Historical Provider, MD  RABEprazole (ACIPHEX) 20 MG tablet Take 20 mg by mouth daily.   Yes Historical Provider, MD  sertraline (ZOLOFT) 100 MG tablet Take 100 mg by mouth every morning.   Yes Historical Provider, MD  traZODone (DESYREL) 100 MG tablet Take 300 mg by mouth at bedtime.    Yes Historical Provider, MD  meclizine (ANTIVERT) 12.5 MG tablet Take 1 tablet (12.5 mg total) by mouth 3 (three) times daily as needed for dizziness. 08/04/14   Joycie Peek, PA-C  metoCLOPramide (REGLAN) 10 MG tablet Take 1 tablet (10 mg total) by mouth every 6 (six) hours. 08/04/14   Joycie Peek, PA-C  naproxen (NAPROSYN) 500 MG tablet Take 1 tablet (500 mg total) by mouth 2 (two) times daily. 08/04/14   Mouhamadou Gittleman, PA-C   BP 121/51 mmHg  Pulse 49  Temp(Src) 97.9 F (36.6 C) (Oral)  Resp 18  SpO2 96% Physical Exam  Constitutional: She is oriented to person, place, and time. She appears well-developed and well-nourished.  HENT:  Head: Normocephalic and atraumatic.  Mouth/Throat: Oropharynx is clear and moist.  Eyes: Conjunctivae are normal. Pupils are equal, round, and reactive to light. Right eye exhibits no discharge. Left eye exhibits no discharge.  No scleral icterus.  Neck: Neck supple.  Cardiovascular: Normal rate, regular rhythm and normal heart sounds.   Pulmonary/Chest: Effort normal and breath sounds normal. No respiratory distress. She has no wheezes. She has no rales.  Abdominal: Soft. There is no tenderness.  Musculoskeletal: She exhibits no tenderness.  Neurological: She is alert and oriented to person, place, and time.  Cranial Nerves II-XII grossly intact. Motor and sensation 5/5 in all 4 extremities. Patient has unsteady gait, family bedside states this is baseline.  Skin: Skin is warm and dry. No rash noted.  Psychiatric: She has a normal mood and affect.  Nursing note and vitals reviewed.   ED Course  Procedures (including critical care time) Labs Review Labs Reviewed - No data to display  Imaging Review No results found.   EKG Interpretation None     Meds given in ED:  Medications  ketorolac (TORADOL) 30 MG/ML injection 30 mg (30 mg Intravenous Given 08/04/14 1732)  metoCLOPramide (REGLAN) injection 10 mg (10 mg Intravenous Given 08/04/14 1733)  diphenhydrAMINE (BENADRYL) injection 50 mg (50 mg Intravenous Given 08/04/14 1735)  meclizine (ANTIVERT) tablet 25 mg (25 mg Oral Given 08/04/14 1941)    New Prescriptions   MECLIZINE (ANTIVERT) 12.5 MG TABLET    Take 1 tablet (12.5 mg total) by mouth 3 (three) times daily as needed for dizziness.   METOCLOPRAMIDE (REGLAN) 10 MG TABLET    Take 1 tablet (10 mg total) by mouth every 6 (six) hours.   NAPROXEN (NAPROSYN) 500 MG TABLET    Take 1 tablet (500 mg total) by mouth 2 (two) times daily.   Filed Vitals:   08/04/14 1919 08/04/14 1936 08/04/14 1942 08/04/14 2103  BP: 119/42  113/50 121/51  Pulse: 50  53 49  Temp:  97.9 F (36.6 C)    TempSrc:      Resp: 18  16 18   SpO2: 96%  93% 96%    MDM  Vitals stable - WNL -afebrile Pt resting comfortably in ED. feels much better after administration of migraine cocktail. Presents with migraine typical of her  vestibular migraines. No new neurological symptoms. PE--neuro exam is unremarkable and patient is at baseline per family.  DDX--low concern for central lesion at this time. Symptoms have resolved with administration of migraine cocktail and meclizine. Will DC with similar medications. Encourage follow-up with PCP for further evaluation and management of symptoms. Family member will be staying with patient tonight and will be able to have patient follow-up with PCP or return to ED immediately if symptoms worsen.  I discussed all relevant lab findings and imaging results with pt and they verbalized understanding. Discussed f/u with PCP within 48 hrs and return precautions, pt very amenable to plan.. Prior to patient discharge, I discussed and reviewed this case with Dr.Kohut   Final diagnoses:  Other migraine without status migrainosus, not intractable       Joycie Peek, PA-C  08/04/14 2118  Raeford Razor, MD 08/06/14 1958

## 2014-08-04 NOTE — ED Notes (Signed)
Pt brought in via [facility] with complaints of a headache for the past 2 weeks. Pt has a history of migraines but had a colonoscopy done approximately 2 weeks ago and since then the headaches have increased in frequency and intensity. Pt is A/O. Pt reports dizzy. Pt has a history of vestibular migraines.

## 2014-08-04 NOTE — ED Notes (Signed)
US ordered in error

## 2015-03-03 ENCOUNTER — Encounter: Payer: Self-pay | Admitting: Internal Medicine

## 2015-05-19 ENCOUNTER — Other Ambulatory Visit: Payer: Self-pay

## 2015-08-05 ENCOUNTER — Encounter: Payer: Self-pay | Admitting: Gastroenterology

## 2016-05-10 ENCOUNTER — Other Ambulatory Visit (HOSPITAL_COMMUNITY): Payer: Self-pay | Admitting: Orthopedic Surgery

## 2016-05-10 DIAGNOSIS — M5136 Other intervertebral disc degeneration, lumbar region: Secondary | ICD-10-CM

## 2016-06-06 ENCOUNTER — Encounter (HOSPITAL_COMMUNITY): Payer: Self-pay | Admitting: *Deleted

## 2016-06-06 NOTE — Progress Notes (Signed)
I called Dr Mittie Bodo office and left a message for Kim,requesting a date on History and Physical and for history section to be completed.

## 2016-06-07 ENCOUNTER — Ambulatory Visit (HOSPITAL_COMMUNITY)
Admission: RE | Admit: 2016-06-07 | Discharge: 2016-06-07 | Disposition: A | Discharge status: Home / Self Care | Payer: Medicare Other | Source: Ambulatory Visit | Attending: Orthopedic Surgery | Admitting: Orthopedic Surgery

## 2016-06-07 ENCOUNTER — Encounter (HOSPITAL_COMMUNITY)
Admission: RE | Disposition: A | Discharge status: Home / Self Care | Payer: Self-pay | Source: Ambulatory Visit | Attending: Orthopedic Surgery

## 2016-06-07 ENCOUNTER — Ambulatory Visit (HOSPITAL_COMMUNITY): Payer: Medicare Other | Admitting: Anesthesiology

## 2016-06-07 DIAGNOSIS — M4186 Other forms of scoliosis, lumbar region: Secondary | ICD-10-CM | POA: Insufficient documentation

## 2016-06-07 DIAGNOSIS — F418 Other specified anxiety disorders: Secondary | ICD-10-CM | POA: Insufficient documentation

## 2016-06-07 DIAGNOSIS — Z79899 Other long term (current) drug therapy: Secondary | ICD-10-CM | POA: Insufficient documentation

## 2016-06-07 DIAGNOSIS — M545 Low back pain: Secondary | ICD-10-CM | POA: Diagnosis present

## 2016-06-07 DIAGNOSIS — M48061 Spinal stenosis, lumbar region without neurogenic claudication: Secondary | ICD-10-CM | POA: Insufficient documentation

## 2016-06-07 DIAGNOSIS — F319 Bipolar disorder, unspecified: Secondary | ICD-10-CM | POA: Diagnosis not present

## 2016-06-07 DIAGNOSIS — M4316 Spondylolisthesis, lumbar region: Secondary | ICD-10-CM | POA: Insufficient documentation

## 2016-06-07 DIAGNOSIS — Z888 Allergy status to other drugs, medicaments and biological substances status: Secondary | ICD-10-CM | POA: Insufficient documentation

## 2016-06-07 DIAGNOSIS — K5730 Diverticulosis of large intestine without perforation or abscess without bleeding: Secondary | ICD-10-CM | POA: Insufficient documentation

## 2016-06-07 DIAGNOSIS — M5136 Other intervertebral disc degeneration, lumbar region: Secondary | ICD-10-CM | POA: Insufficient documentation

## 2016-06-07 DIAGNOSIS — Z9889 Other specified postprocedural states: Secondary | ICD-10-CM | POA: Insufficient documentation

## 2016-06-07 HISTORY — PX: RADIOLOGY WITH ANESTHESIA: SHX6223

## 2016-06-07 HISTORY — DX: Claustrophobia: F40.240

## 2016-06-07 HISTORY — DX: Irritable bowel syndrome, unspecified: K58.9

## 2016-06-07 HISTORY — DX: Bipolar disorder, unspecified: F31.9

## 2016-06-07 HISTORY — DX: Constipation, unspecified: K59.00

## 2016-06-07 HISTORY — DX: Dizziness and giddiness: R42

## 2016-06-07 LAB — CBC
HCT: 40 % (ref 36.0–46.0)
Hemoglobin: 13.3 g/dL (ref 12.0–15.0)
MCH: 29.4 pg (ref 26.0–34.0)
MCHC: 33.3 g/dL (ref 30.0–36.0)
MCV: 88.3 fL (ref 78.0–100.0)
Platelets: 312 10*3/uL (ref 150–400)
RBC: 4.53 MIL/uL (ref 3.87–5.11)
RDW: 14 % (ref 11.5–15.5)
WBC: 6.8 10*3/uL (ref 4.0–10.5)

## 2016-06-07 LAB — BASIC METABOLIC PANEL
Anion gap: 8 (ref 5–15)
BUN: 15 mg/dL (ref 6–20)
CALCIUM: 9.6 mg/dL (ref 8.9–10.3)
CO2: 27 mmol/L (ref 22–32)
CREATININE: 0.97 mg/dL (ref 0.44–1.00)
Chloride: 103 mmol/L (ref 101–111)
GFR calc Af Amer: >60 mL/min (ref >60)
GFR, EST NON AFRICAN AMERICAN: 60 mL/min — AB (ref >60)
Glucose, Bld: 102 mg/dL — ABNORMAL HIGH (ref 65–99)
Potassium: 3.6 mmol/L (ref 3.5–5.1)
SODIUM: 138 mmol/L (ref 135–145)

## 2016-06-07 SURGERY — RADIOLOGY WITH ANESTHESIA
Anesthesia: General

## 2016-06-07 MED ORDER — METOCLOPRAMIDE HCL 5 MG/ML IJ SOLN
10.0000 mg | Freq: Once | INTRAMUSCULAR | Status: DC | PRN
Start: 2016-06-07 — End: 2016-06-07

## 2016-06-07 MED ORDER — FENTANYL CITRATE (PF) 100 MCG/2ML IJ SOLN
25.0000 ug | INTRAMUSCULAR | Status: DC | PRN
Start: 2016-06-07 — End: 2016-06-07

## 2016-06-07 MED ORDER — MEPERIDINE HCL 25 MG/ML IJ SOLN: 6.2500 mg | INTRAMUSCULAR | Status: DC | PRN

## 2016-06-07 MED ORDER — LACTATED RINGERS IV SOLN: INTRAVENOUS | Status: DC

## 2016-06-07 MED ORDER — LACTATED RINGERS IV SOLN
INTRAVENOUS | Status: DC
  Administered 2016-06-07: 50 mL/h via INTRAVENOUS

## 2016-06-07 NOTE — Transfer of Care (Signed)
Immediate Anesthesia Transfer of Care Note  Patient: Robin Collier  Procedure(s) Performed: Procedure(s): MRI LUMBAR SPINE WITHOUT CONTRAST (N/A)  Patient Location: PACU  Anesthesia Type:General  Level of Consciousness: awake, alert , oriented and patient cooperative  Airway & Oxygen Therapy: Patient Spontanous Breathing and Patient connected to nasal cannula oxygen  Post-op Assessment: Report given to RN and Post -op Vital signs reviewed and stable  Post vital signs: Reviewed and stable  Last Vitals:  Vitals:   06/07/16 0644 06/07/16 0908  BP:  (P) 134/63  Pulse:  (!) (P) 56  Resp:  (P) 20  Temp: 36.6 C (P) 36.4 C    Last Pain:  Vitals:   06/07/16 0642  TempSrc:   PainSc: 2       Patients Stated Pain Goal: 3 (06/07/16 1610)  Complications: No apparent anesthesia complications

## 2016-06-07 NOTE — Anesthesia Postprocedure Evaluation (Signed)
Anesthesia Post Note  Patient: ANAYANSI RUNDQUIST  Procedure(s) Performed: Procedure(s) (LRB): MRI LUMBAR SPINE WITHOUT CONTRAST (N/A)  Patient location during evaluation: PACU Anesthesia Type: General Level of consciousness: awake and alert Pain management: pain level controlled Vital Signs Assessment: post-procedure vital signs reviewed and stable Respiratory status: spontaneous breathing, nonlabored ventilation, respiratory function stable and patient connected to nasal cannula oxygen Cardiovascular status: blood pressure returned to baseline and stable Postop Assessment: no signs of nausea or vomiting Anesthetic complications: no       Last Vitals:  Vitals:   06/07/16 0930 06/07/16 0958  BP: 136/73 128/70  Pulse: (!) 53 75  Resp:    Temp: 36.4 C     Last Pain:  Vitals:   06/07/16 0958  TempSrc:   PainSc: 0-No pain                 Phillips Grout

## 2016-06-07 NOTE — Progress Notes (Signed)
Report given to grace rn as caregiver 

## 2016-06-07 NOTE — Anesthesia Preprocedure Evaluation (Signed)
Anesthesia Evaluation  Patient identified by MRN, date of birth, ID band Patient awake    Reviewed: Allergy & Precautions, NPO status , Patient's Chart, lab work & pertinent test results  Airway Mallampati: II  TM Distance: >3 FB Neck ROM: Full    Dental no notable dental hx.    Pulmonary neg pulmonary ROS,    Pulmonary exam normal breath sounds clear to auscultation       Cardiovascular negative cardio ROS Normal cardiovascular exam Rhythm:Regular Rate:Normal     Neuro/Psych Seizures - (pseudoseizures),  PSYCHIATRIC DISORDERS (clautraphobia) Anxiety Depression Bipolar Disorder    GI/Hepatic negative GI ROS, Neg liver ROS,   Endo/Other  negative endocrine ROS  Renal/GU negative Renal ROS  negative genitourinary   Musculoskeletal negative musculoskeletal ROS (+)   Abdominal   Peds negative pediatric ROS (+)  Hematology negative hematology ROS (+)   Anesthesia Other Findings   Reproductive/Obstetrics negative OB ROS                            Anesthesia Physical Anesthesia Plan  ASA: II  Anesthesia Plan: General   Post-op Pain Management:    Induction: Intravenous  Airway Management Planned: Oral ETT and LMA  Additional Equipment:   Intra-op Plan:   Post-operative Plan: Extubation in OR  Informed Consent: I have reviewed the patients History and Physical, chart, labs and discussed the procedure including the risks, benefits and alternatives for the proposed anesthesia with the patient or authorized representative who has indicated his/her understanding and acceptance.   Dental advisory given  Plan Discussed with: CRNA  Anesthesia Plan Comments:         Anesthesia Quick Evaluation

## 2016-06-08 ENCOUNTER — Encounter (HOSPITAL_COMMUNITY): Payer: Self-pay | Admitting: Radiology

## 2016-06-08 MED FILL — Propofol IV Emul 200 MG/20ML (10 MG/ML): INTRAVENOUS | Qty: 20 | Status: AC

## 2016-06-08 MED FILL — Ondansetron HCl Inj 4 MG/2ML (2 MG/ML): INTRAMUSCULAR | Qty: 2 | Status: AC

## 2016-06-08 MED FILL — Ephedrine Sulf-NaCl Soln Pref Syr 50 MG/10ML-0.9% (5 MG/ML): INTRAVENOUS | Qty: 10 | Status: AC

## 2016-06-08 MED FILL — Phenylephrine-NaCl Pref Syr 0.4 MG/10ML-0.9% (40 MCG/ML): INTRAVENOUS | Qty: 10 | Status: AC

## 2016-06-08 MED FILL — Lidocaine HCl IV Inj 20 MG/ML: INTRAVENOUS | Qty: 5 | Status: AC

## 2016-09-15 ENCOUNTER — Other Ambulatory Visit: Payer: Self-pay | Admitting: Internal Medicine

## 2016-09-15 DIAGNOSIS — H532 Diplopia: Secondary | ICD-10-CM

## 2016-09-28 ENCOUNTER — Other Ambulatory Visit (HOSPITAL_COMMUNITY): Payer: Self-pay | Admitting: Internal Medicine

## 2016-10-17 ENCOUNTER — Other Ambulatory Visit: Payer: Self-pay

## 2016-11-16 ENCOUNTER — Encounter (HOSPITAL_COMMUNITY): Payer: Self-pay | Admitting: *Deleted

## 2016-11-16 NOTE — Anesthesia Preprocedure Evaluation (Signed)
Anesthesia Evaluation  Patient identified by MRN, date of birth, ID band Patient awake    Reviewed: Allergy & Precautions, NPO status , Patient's Chart, lab work & pertinent test results, reviewed documented beta blocker date and time   Airway Mallampati: II  TM Distance: >3 FB Neck ROM: Full    Dental no notable dental hx.    Pulmonary neg pulmonary ROS,    Pulmonary exam normal breath sounds clear to auscultation       Cardiovascular hypertension, Pt. on home beta blockers Normal cardiovascular exam Rhythm:Regular Rate:Normal     Neuro/Psych Seizures - (pseudoseizures),  PSYCHIATRIC DISORDERS (clautraphobia) Anxiety Depression Bipolar Disorder    GI/Hepatic negative GI ROS, Neg liver ROS,   Endo/Other  negative endocrine ROS  Renal/GU negative Renal ROS  negative genitourinary   Musculoskeletal negative musculoskeletal ROS (+)   Abdominal   Peds negative pediatric ROS (+)  Hematology negative hematology ROS (+)   Anesthesia Other Findings   Reproductive/Obstetrics negative OB ROS                             Anesthesia Physical  Anesthesia Plan  ASA: II  Anesthesia Plan: General   Post-op Pain Management:    Induction: Intravenous  PONV Risk Score and Plan: 3 and Ondansetron, Dexamethasone and Midazolam  Airway Management Planned: Oral ETT  Additional Equipment:   Intra-op Plan:   Post-operative Plan: Extubation in OR  Informed Consent: I have reviewed the patients History and Physical, chart, labs and discussed the procedure including the risks, benefits and alternatives for the proposed anesthesia with the patient or authorized representative who has indicated his/her understanding and acceptance.   Dental advisory given  Plan Discussed with: CRNA  Anesthesia Plan Comments:         Anesthesia Quick Evaluation

## 2016-11-16 NOTE — Progress Notes (Signed)
Spoke with pt for pre-op call. Pt denies cardiac history, chest pain or sob. Pt states she does not have HTN, is on Inderal for "tremors". Pt states she is not diabetic.

## 2016-11-17 ENCOUNTER — Ambulatory Visit (HOSPITAL_COMMUNITY): Payer: Medicare Other | Admitting: Anesthesiology

## 2016-11-17 ENCOUNTER — Ambulatory Visit (HOSPITAL_COMMUNITY)
Admission: RE | Admit: 2016-11-17 | Discharge: 2016-11-17 | Disposition: A | Discharge status: Home / Self Care | Payer: Medicare Other | Source: Ambulatory Visit | Attending: Internal Medicine | Admitting: Internal Medicine

## 2016-11-17 ENCOUNTER — Encounter (HOSPITAL_COMMUNITY)
Admission: RE | Disposition: A | Discharge status: Home / Self Care | Payer: Medicare Other | Source: Ambulatory Visit | Attending: Internal Medicine

## 2016-11-17 ENCOUNTER — Encounter (HOSPITAL_COMMUNITY): Payer: Self-pay | Admitting: Anesthesiology

## 2016-11-17 DIAGNOSIS — F419 Anxiety disorder, unspecified: Secondary | ICD-10-CM | POA: Insufficient documentation

## 2016-11-17 DIAGNOSIS — H532 Diplopia: Secondary | ICD-10-CM | POA: Insufficient documentation

## 2016-11-17 DIAGNOSIS — F329 Major depressive disorder, single episode, unspecified: Secondary | ICD-10-CM | POA: Diagnosis not present

## 2016-11-17 DIAGNOSIS — Z791 Long term (current) use of non-steroidal anti-inflammatories (NSAID): Secondary | ICD-10-CM | POA: Insufficient documentation

## 2016-11-17 DIAGNOSIS — I10 Essential (primary) hypertension: Secondary | ICD-10-CM | POA: Insufficient documentation

## 2016-11-17 DIAGNOSIS — Z79899 Other long term (current) drug therapy: Secondary | ICD-10-CM | POA: Diagnosis not present

## 2016-11-17 HISTORY — DX: Diverticulitis of intestine, part unspecified, without perforation or abscess without bleeding: K57.92

## 2016-11-17 HISTORY — DX: Unspecified osteoarthritis, unspecified site: M19.90

## 2016-11-17 HISTORY — PX: RADIOLOGY WITH ANESTHESIA: SHX6223

## 2016-11-17 HISTORY — DX: Restless legs syndrome: G25.81

## 2016-11-17 HISTORY — DX: Gastro-esophageal reflux disease without esophagitis: K21.9

## 2016-11-17 HISTORY — DX: Tremor, unspecified: R25.1

## 2016-11-17 HISTORY — DX: Fatty (change of) liver, not elsewhere classified: K76.0

## 2016-11-17 LAB — BASIC METABOLIC PANEL
Anion gap: 11 (ref 5–15)
BUN: 8 mg/dL (ref 6–20)
CHLORIDE: 102 mmol/L (ref 101–111)
CO2: 26 mmol/L (ref 22–32)
CREATININE: 0.87 mg/dL (ref 0.44–1.00)
Calcium: 9.1 mg/dL (ref 8.9–10.3)
GFR calc Af Amer: >60 mL/min (ref >60)
GFR calc non Af Amer: >60 mL/min (ref >60)
GLUCOSE: 104 mg/dL — AB (ref 65–99)
POTASSIUM: 3.3 mmol/L — AB (ref 3.5–5.1)
SODIUM: 139 mmol/L (ref 135–145)

## 2016-11-17 LAB — CBC
HEMATOCRIT: 39.4 % (ref 36.0–46.0)
HEMOGLOBIN: 13.1 g/dL (ref 12.0–15.0)
MCH: 29.8 pg (ref 26.0–34.0)
MCHC: 33.2 g/dL (ref 30.0–36.0)
MCV: 89.7 fL (ref 78.0–100.0)
Platelets: 344 10*3/uL (ref 150–400)
RBC: 4.39 MIL/uL (ref 3.87–5.11)
RDW: 14.8 % (ref 11.5–15.5)
WBC: 6.2 10*3/uL (ref 4.0–10.5)

## 2016-11-17 SURGERY — RADIOLOGY WITH ANESTHESIA
Anesthesia: General

## 2016-11-17 MED ORDER — MIDAZOLAM HCL 5 MG/5ML IJ SOLN
INTRAMUSCULAR | Status: DC | PRN
  Administered 2016-11-17: 2 mg via INTRAVENOUS

## 2016-11-17 MED ORDER — FENTANYL CITRATE (PF) 100 MCG/2ML IJ SOLN
INTRAMUSCULAR | Status: DC | PRN
  Administered 2016-11-17 (×2): 50 ug via INTRAVENOUS

## 2016-11-17 MED ORDER — LACTATED RINGERS IV SOLN
INTRAVENOUS | Status: DC
  Administered 2016-11-17 (×2): via INTRAVENOUS

## 2016-11-17 MED ORDER — PROMETHAZINE HCL 25 MG/ML IJ SOLN: 6.2500 mg | INTRAMUSCULAR | Status: DC | PRN

## 2016-11-17 MED ORDER — LIDOCAINE HCL (CARDIAC) 20 MG/ML IV SOLN
INTRAVENOUS | Status: DC | PRN
  Administered 2016-11-17: 100 mg via INTRAVENOUS

## 2016-11-17 MED ORDER — EPHEDRINE SULFATE 50 MG/ML IJ SOLN
INTRAMUSCULAR | Status: DC | PRN
  Administered 2016-11-17: 10 mg via INTRAVENOUS

## 2016-11-17 MED ORDER — PROPOFOL 10 MG/ML IV BOLUS
INTRAVENOUS | Status: DC | PRN
  Administered 2016-11-17: 150 mg via INTRAVENOUS

## 2016-11-17 MED ORDER — MEPERIDINE HCL 25 MG/ML IJ SOLN: 6.2500 mg | INTRAMUSCULAR | Status: DC | PRN

## 2016-11-17 MED ORDER — SUCCINYLCHOLINE CHLORIDE 20 MG/ML IJ SOLN
INTRAMUSCULAR | Status: DC | PRN
  Administered 2016-11-17: 100 mg via INTRAVENOUS

## 2016-11-17 MED ORDER — ONDANSETRON HCL 4 MG/2ML IJ SOLN
INTRAMUSCULAR | Status: DC | PRN
  Administered 2016-11-17: 4 mg via INTRAVENOUS

## 2016-11-17 MED ORDER — GADOBENATE DIMEGLUMINE 529 MG/ML IV SOLN
20.0000 mL | Freq: Once | INTRAVENOUS | Status: AC
  Administered 2016-11-17: 18 mL via INTRAVENOUS

## 2016-11-17 MED ORDER — DEXAMETHASONE SODIUM PHOSPHATE 10 MG/ML IJ SOLN
INTRAMUSCULAR | Status: DC | PRN
  Administered 2016-11-17: 10 mg via INTRAVENOUS

## 2016-11-17 NOTE — H&P (Signed)
Anesthesia H&P Update: History and Physical Exam reviewed; patient is OK for planned anesthetic and procedure. ? ?

## 2016-11-17 NOTE — Transfer of Care (Signed)
Immediate Anesthesia Transfer of Care Note  Patient: Robin Collier  Procedure(s) Performed: Procedure(s): MRI OF BRAIN WITH AND WITHOUT CONTRAST (N/A)  Patient Location: PACU  Anesthesia Type:General  Level of Consciousness: awake, alert , oriented and patient cooperative  Airway & Oxygen Therapy: Patient Spontanous Breathing and Patient connected to nasal cannula oxygen  Post-op Assessment: Report given to RN and Post -op Vital signs reviewed and stable  Post vital signs: Reviewed and stable  Last Vitals:  Vitals:   11/17/16 1015 11/17/16 1027  BP:  122/75  Pulse: 64 61  Resp: 14 18  Temp:  (!) 36.4 C  SpO2: 95% 93%    Last Pain:  Vitals:   11/17/16 2440  TempSrc: Oral      Patients Stated Pain Goal: 0 (11/17/16 1027)  Complications: No apparent anesthesia complications

## 2016-11-18 ENCOUNTER — Encounter (HOSPITAL_COMMUNITY): Payer: Self-pay | Admitting: Radiology

## 2016-11-18 NOTE — Anesthesia Postprocedure Evaluation (Signed)
Anesthesia Post Note  Patient: Robin Collier  Procedure(s) Performed: Procedure(s) (LRB): MRI OF BRAIN WITH AND WITHOUT CONTRAST (N/A)     Patient location during evaluation: PACU Anesthesia Type: General Level of consciousness: sedated and patient cooperative Pain management: pain level controlled Vital Signs Assessment: post-procedure vital signs reviewed and stable Respiratory status: spontaneous breathing Cardiovascular status: stable Anesthetic complications: no    Last Vitals:  Vitals:   11/17/16 1015 11/17/16 1027  BP:  122/75  Pulse: 64 61  Resp: 14 18  Temp:  (!) 36.4 C  SpO2: 95% 93%    Last Pain:  Vitals:   11/17/16 7829  TempSrc: Oral                 Lewie Loron

## 2016-12-22 NOTE — H&P (Signed)
Anesthesia H&P Update: History and Physical Exam reviewed; patient is OK for planned anesthetic and procedure. ? ?

## 2017-02-15 ENCOUNTER — Ambulatory Visit (INDEPENDENT_AMBULATORY_CARE_PROVIDER_SITE_OTHER): Payer: Medicare Other | Admitting: Neurology

## 2017-02-15 ENCOUNTER — Encounter: Payer: Self-pay | Admitting: Neurology

## 2017-02-15 DIAGNOSIS — R42 Dizziness and giddiness: Secondary | ICD-10-CM

## 2017-02-15 DIAGNOSIS — G250 Essential tremor: Secondary | ICD-10-CM | POA: Insufficient documentation

## 2017-02-15 DIAGNOSIS — R269 Unspecified abnormalities of gait and mobility: Secondary | ICD-10-CM | POA: Diagnosis not present

## 2017-02-15 DIAGNOSIS — G43019 Migraine without aura, intractable, without status migrainosus: Secondary | ICD-10-CM | POA: Insufficient documentation

## 2017-02-15 DIAGNOSIS — H532 Diplopia: Secondary | ICD-10-CM

## 2017-02-15 HISTORY — DX: Migraine without aura, intractable, without status migrainosus: G43.019

## 2017-02-15 HISTORY — DX: Essential tremor: G25.0

## 2017-02-15 HISTORY — DX: Unspecified abnormalities of gait and mobility: R26.9

## 2017-02-15 HISTORY — DX: Diplopia: H53.2

## 2017-02-15 NOTE — Progress Notes (Signed)
Reason for visit: Diplopia  Referring physician: Dr. Jannet AskewGrisso  Robin Collier is a 66 y.o. female  History of present illness:  [name] is a 66 year old right-handed white female with a long-standing history of anxiety and depression.  The patient has had problems with episodic vertigo since 2007, she has been felt to have vertigo associated with migraine.  The patient may have 3 or so events a year lasting anywhere from 2 days to 1 week.  The patient indicates that she has had a lifelong problem with balance, she uses a cane or walker to get around.  She feels chronic weakness in both legs and has a sensation that the legs will collapse.  She reports that she has headaches every day, this has been going on for several decades.  The patient in the last 2 or 3 years developed some problems with double vision that has a vertical and horizontal component.  The double vision has become a bit more prominent as time has gone on.  The double vision will come and go at times.  The patient denies any significant droopiness of the eyelids, she denies problems with chewing or swallowing.  The patient reports tremors that involve the head and neck and arms.  She has recently found to have a low vitamin B12 level, she is on shots at this time.  The patient denies a significant family history of tremor although her mother may have had a mild tremor.  The patient has undergone MRI evaluation of the brain recently, this was unremarkable.  MRI of the neck was done in 2013 and did not show any etiology of her gait problem.  The patient is sent to this office for further evaluation.  Past Medical History:  Diagnosis Date  . Anxiety    Panic attacks  . Arthritis   . Bipolar disorder [facility](HCC)    one Dr says yes, one says no  . Claustrophobia   . Constipation   . Depression   . Diverticulitis   . Fatty liver   . GERD (gastroesophageal reflux disease)   . Headache(784.0)    migraines  . Hypertension    Dr Shary DecampGrisso told me that I do not have high blood preazaure and I am not on medication  . IBS (irritable bowel syndrome)   . Mental disorder   . Restless legs syndrome   . Seizures (HCC)    "not really seziure"  . Tremors of nervous system   . Vertigo    06/06/16-  last 1. 5 years    Past Surgical History:  Procedure Laterality Date  . ABDOMINAL HYSTERECTOMY  '90  . BACK SURGERY  2009   Discectomy  . BLADDER SUSPENSION    . COLONOSCOPY W/ POLYPECTOMY    . EYE SURGERY Bilateral    Cataract  . JOINT REPLACEMENT Right    Knee   . RADIOLOGY WITH ANESTHESIA N/A 06/07/2016   Procedure: MRI LUMBAR SPINE WITHOUT CONTRAST;  Surgeon: Medication Radiologist, MD;  Location: [facility] OR;  Service: Radiology;  Laterality: N/A;  . RADIOLOGY WITH ANESTHESIA N/A 11/17/2016   Procedure: MRI OF BRAIN WITH AND WITHOUT CONTRAST;  Surgeon: Radiologist, Medication, MD;  Location: [facility] OR;  Service: Radiology;  Laterality: N/A;    Family History  Problem Relation Age of Onset  . Cancer Mother   . Cancer Father   . Diabetes Father     Social history:  reports that  has never smoked. she has never used smokeless  tobacco. She reports that she does not drink alcohol or use drugs.  Medications:  Prior to Admission medications   Medication Sig Start Date End Date Taking? Authorizing Provider  acetaminophen (TYLENOL) 325 MG tablet Take 650 mg by mouth every 6 (six) hours as needed for mild pain or headache.   Yes [provider]  clonazePAM (KLONOPIN) 0.5 MG tablet Take 0.5 mg by mouth 2 (two) times daily.    Yes [provider]  dimenhyDRINATE (DRAMAMINE) 50 MG tablet Take 50 mg by mouth as needed.   Yes [provider]  lamoTRIgine (LAMICTAL) 200 MG tablet Take 200 mg by mouth 2 (two) times daily.    Yes [provider]  OVER THE COUNTER MEDICATION Take 1 tablet by mouth daily. Livco Vitamin   Yes [provider]  propranolol (INDERAL) 60 MG tablet Take 60 mg by mouth  daily.   Yes [provider]  RABEprazole (ACIPHEX) 20 MG tablet Take 20 mg by mouth daily before breakfast.    Yes [provider]  sertraline (ZOLOFT) 100 MG tablet Take 100 mg by mouth daily.    Yes [provider]  traZODone (DESYREL) 100 MG tablet Take 300 mg by mouth at bedtime.    Yes [provider]      Allergies  Allergen Reactions  . Gabapentin Nausea And Vomiting    Lightheadness  . Hydrocodone     Dizziness  . Seroquel [Quetiapine Fumarate] Nausea Only and Other (See Comments)    Vertigo and seizure-like activity    ROS:  Out of a complete 14 system review of symptoms, the patient complains only of the following symptoms, and all other reviewed systems are negative.  Weight gain, fatigue Ringing in the ears, dizziness Blurred vision, double vision, loss of vision, eye pain Shortness of breath, cough, wheezing, snoring Constipation Joint pain Allergies, runny nose Headache, weakness, slurred speech associated with vertigo, dizziness, tremor Depression, anxiety, decreased energy, disinterest in activities, racing thoughts  Blood pressure 140/82, pulse 80, height 5\' 3"  (1.6 m), SpO2 95 %.  Physical Exam  General: The patient is alert and cooperative at the time of the examination.  Eyes: Pupils are equal, round, and reactive to light. Discs are flat bilaterally.  Neck: The neck is supple, no carotid bruits are noted.  Respiratory: The respiratory examination is clear.  Cardiovascular: The cardiovascular examination reveals a regular rate and rhythm, no obvious murmurs or rubs are noted.  Skin: Extremities are without significant edema.  Neurologic Exam  Mental status: The patient is alert and oriented x 3 at the time of the examination. The patient has apparent normal recent and remote memory, with an apparently normal attention span and concentration ability.  Cranial nerves: Facial symmetry is present. There is good  sensation of the face to pinprick and soft touch bilaterally. The strength of the facial muscles and the muscles to head turning and shoulder shrug are normal bilaterally. Speech is well enunciated, no aphasia or dysarthria is noted. Extraocular movements are full, but with superior gaze there is slight exotropia of the right eye. Visual fields are full. The tongue is midline, and the patient has symmetric elevation of the soft palate. No obvious hearing deficits are noted.  Motor: The motor testing reveals 5 over 5 strength of all 4 extremities. Good symmetric motor tone is noted throughout.  Sensory: Sensory testing is intact to pinprick, soft touch, vibration sensation, and position sense on all 4 extremities. No evidence of extinction  is noted.  Coordination: Cerebellar testing reveals good finger-nose-finger and heel-to-shin bilaterally.  Gait and station: Gait is wide-based, unsteady.  Tandem gait is unsteady. Romberg is negative, but is unsteady. No drift is seen.  Reflexes: Deep tendon reflexes are symmetric and normal bilaterally. Toes are downgoing bilaterally.   MRI brain 11/17/16:  IMPRESSION: Stable since 2016 and normal for age MRI appearance of the brain.  * MRI scan images were reviewed online. I agree with the written report.    Assessment/Plan:  1.  Chronic double vision  2.  Chronic episodic vertigo  3.  Chronic daily headaches  4.  Chronic gait disturbance  5.  Essential tremor  The patient appears to have multiple issues.  The patient reports chronic issues with vertigo, headaches, and gait disturbance.  She has an essential tremor.  She reports a 2 or 3-year history of double vision.  MRI of the brain has been unremarkable, this does not show any etiology for the vertigo, gait disturbance, double vision, or tremor problem.  The patient will undergo further blood work today.  She may be considered for a trial on Mestinon if the blood work is unremarkable.  The  patient will follow-up in 4-6 months.  Marlan Palau. Keith Caisen Mangas MD 02/15/2017 11:20 AM  [facility] [address] [place], [place] [id number]  Phone [phone] Fax [phone]

## 2017-02-15 NOTE — Patient Instructions (Addendum)
We will get bloodwork today.

## 2017-02-17 ENCOUNTER — Telehealth: Payer: Self-pay | Admitting: Neurology

## 2017-02-17 LAB — B. BURGDORFI ANTIBODIES: Lyme IgG/IgM Ab: <0.91 {ISR} (ref 0.00–0.90)

## 2017-02-17 LAB — ANA W/REFLEX: Anti Nuclear Antibody(ANA): NEGATIVE

## 2017-02-17 LAB — SEDIMENTATION RATE: SED RATE: 7 mm/h (ref 0–40)

## 2017-02-17 LAB — COPPER, SERUM: Copper: 95 ug/dL (ref 72–166)

## 2017-02-17 LAB — ACETYLCHOLINE RECEPTOR, BINDING: AChR Binding Ab, Serum: <0.03 nmol/L (ref 0.00–0.24)

## 2017-02-17 LAB — RHEUMATOID FACTOR: Rheumatoid fact SerPl-aCnc: 11.5 [IU]/mL (ref 0.0–13.9)

## 2017-02-17 LAB — TSH: TSH: 1.73 u[IU]/mL (ref 0.450–4.500)

## 2017-02-17 MED ORDER — PYRIDOSTIGMINE BROMIDE 60 MG PO TABS: 30.0000 mg | ORAL_TABLET | Freq: Three times a day (TID) | ORAL | 1 refills | Status: DC

## 2017-02-17 NOTE — Telephone Encounter (Signed)
I called the patient.  The blood work was unremarkable.  We will give an empiric trial on Mestinon taking 30 mg 3 times daily, she will stop the drug in 2 weeks if it offers no benefit.

## 2017-03-10 ENCOUNTER — Telehealth: Payer: Self-pay | Admitting: Neurology

## 2017-03-10 MED ORDER — PYRIDOSTIGMINE BROMIDE 60 MG PO TABS: 30.0000 mg | ORAL_TABLET | Freq: Three times a day (TID) | ORAL | 3 refills | Status: DC

## 2017-03-10 NOTE — Telephone Encounter (Signed)
Pts daughter is requesting a refill for   pyridostigmine (MESTINON) 60 MG tablet  Sent to [id number] - [place], [place] - [address]

## 2017-03-10 NOTE — Telephone Encounter (Signed)
I called to talk with the daughter.  The patient believes that she is getting benefit with the Mestinon.  I will call in another prescription for the medication.

## 2017-03-16 NOTE — Telephone Encounter (Signed)
I called the patient.  The patient may have developed a rash on the Mestinon, she will stop the medication to see if the rash goes away.  She believes that the Mestinon was helping her.

## 2017-03-16 NOTE — Telephone Encounter (Signed)
Pt calling re: the pyridostigmine (MESTINON) 60 MG tablet, pt states since she has been using this medication she has a rash on her chest and arm.  Pt would like a call back to discuss

## 2017-03-16 NOTE — Addendum Note (Signed)
Addended by: York SpanielWILLIS, Sundy Houchins K on: 03/16/2017 04:48 PM   Modules accepted: Orders

## 2017-03-16 NOTE — Telephone Encounter (Signed)
Pts daughter would prefer for RN or Dr. Anne HahnWillis to call her to discuss. Contact Kim at [phone]

## 2017-05-11 ENCOUNTER — Other Ambulatory Visit (HOSPITAL_COMMUNITY): Payer: Self-pay | Admitting: Physician Assistant

## 2017-05-11 DIAGNOSIS — M48061 Spinal stenosis, lumbar region without neurogenic claudication: Secondary | ICD-10-CM

## 2017-05-24 ENCOUNTER — Encounter (HOSPITAL_COMMUNITY): Payer: Self-pay | Admitting: *Deleted

## 2017-05-24 ENCOUNTER — Other Ambulatory Visit: Payer: Self-pay

## 2017-05-24 NOTE — Progress Notes (Signed)
SDW-pre-op call completed by pt daughter, [facility]ico [facility]Kim [facility] Daughter denies that pt C/O SOB and chest pain. Daughter denies that pt is under the care of a cardiologist. Daughter made aware to have pt stop taking vitamins and herbal medications. Requested stress test results and any cardiac studies from Dr. Tomie Chinaevankar. Daughter not sure if pt had a chest x ray within the last year. Daughter verbalized understanding of all pre-op instructions.

## 2017-05-25 ENCOUNTER — Encounter (HOSPITAL_COMMUNITY)
Admission: RE | Disposition: A | Discharge status: Home / Self Care | Payer: Self-pay | Source: Ambulatory Visit | Attending: Physician Assistant

## 2017-05-25 ENCOUNTER — Ambulatory Visit (HOSPITAL_COMMUNITY): Payer: Medicare Other | Admitting: Emergency Medicine

## 2017-05-25 ENCOUNTER — Ambulatory Visit (HOSPITAL_COMMUNITY)
Admission: RE | Admit: 2017-05-25 | Discharge: 2017-05-25 | Disposition: A | Discharge status: Home / Self Care | Payer: Medicare Other | Source: Ambulatory Visit | Attending: Physician Assistant | Admitting: Physician Assistant

## 2017-05-25 ENCOUNTER — Encounter (HOSPITAL_COMMUNITY): Payer: Self-pay | Admitting: *Deleted

## 2017-05-25 DIAGNOSIS — Z888 Allergy status to other drugs, medicaments and biological substances status: Secondary | ICD-10-CM | POA: Insufficient documentation

## 2017-05-25 DIAGNOSIS — R42 Dizziness and giddiness: Secondary | ICD-10-CM | POA: Insufficient documentation

## 2017-05-25 DIAGNOSIS — Z90710 Acquired absence of both cervix and uterus: Secondary | ICD-10-CM | POA: Insufficient documentation

## 2017-05-25 DIAGNOSIS — R269 Unspecified abnormalities of gait and mobility: Secondary | ICD-10-CM | POA: Diagnosis not present

## 2017-05-25 DIAGNOSIS — K589 Irritable bowel syndrome without diarrhea: Secondary | ICD-10-CM | POA: Diagnosis not present

## 2017-05-25 DIAGNOSIS — G8929 Other chronic pain: Secondary | ICD-10-CM | POA: Insufficient documentation

## 2017-05-25 DIAGNOSIS — Z86010 Personal history of colonic polyps: Secondary | ICD-10-CM | POA: Diagnosis not present

## 2017-05-25 DIAGNOSIS — Z79899 Other long term (current) drug therapy: Secondary | ICD-10-CM | POA: Diagnosis not present

## 2017-05-25 DIAGNOSIS — Z833 Family history of diabetes mellitus: Secondary | ICD-10-CM | POA: Insufficient documentation

## 2017-05-25 DIAGNOSIS — G43019 Migraine without aura, intractable, without status migrainosus: Secondary | ICD-10-CM | POA: Insufficient documentation

## 2017-05-25 DIAGNOSIS — F419 Anxiety disorder, unspecified: Secondary | ICD-10-CM | POA: Insufficient documentation

## 2017-05-25 DIAGNOSIS — M545 Low back pain: Secondary | ICD-10-CM | POA: Diagnosis not present

## 2017-05-25 DIAGNOSIS — Z885 Allergy status to narcotic agent status: Secondary | ICD-10-CM | POA: Insufficient documentation

## 2017-05-25 DIAGNOSIS — R569 Unspecified convulsions: Secondary | ICD-10-CM | POA: Insufficient documentation

## 2017-05-25 DIAGNOSIS — M5410 Radiculopathy, site unspecified: Secondary | ICD-10-CM | POA: Insufficient documentation

## 2017-05-25 DIAGNOSIS — Z809 Family history of malignant neoplasm, unspecified: Secondary | ICD-10-CM | POA: Diagnosis not present

## 2017-05-25 DIAGNOSIS — F319 Bipolar disorder, unspecified: Secondary | ICD-10-CM | POA: Insufficient documentation

## 2017-05-25 DIAGNOSIS — E785 Hyperlipidemia, unspecified: Secondary | ICD-10-CM | POA: Diagnosis not present

## 2017-05-25 DIAGNOSIS — K5790 Diverticulosis of intestine, part unspecified, without perforation or abscess without bleeding: Secondary | ICD-10-CM | POA: Insufficient documentation

## 2017-05-25 DIAGNOSIS — G250 Essential tremor: Secondary | ICD-10-CM | POA: Insufficient documentation

## 2017-05-25 DIAGNOSIS — M48061 Spinal stenosis, lumbar region without neurogenic claudication: Secondary | ICD-10-CM

## 2017-05-25 DIAGNOSIS — H409 Unspecified glaucoma: Secondary | ICD-10-CM | POA: Insufficient documentation

## 2017-05-25 DIAGNOSIS — Z96651 Presence of right artificial knee joint: Secondary | ICD-10-CM | POA: Diagnosis not present

## 2017-05-25 DIAGNOSIS — I10 Essential (primary) hypertension: Secondary | ICD-10-CM | POA: Insufficient documentation

## 2017-05-25 DIAGNOSIS — K219 Gastro-esophageal reflux disease without esophagitis: Secondary | ICD-10-CM | POA: Insufficient documentation

## 2017-05-25 HISTORY — PX: RADIOLOGY WITH ANESTHESIA: SHX6223

## 2017-05-25 LAB — CBC
HCT: 43.4 % (ref 36.0–46.0)
HEMOGLOBIN: 14.1 g/dL (ref 12.0–15.0)
MCH: 29.6 pg (ref 26.0–34.0)
MCHC: 32.5 g/dL (ref 30.0–36.0)
MCV: 91 fL (ref 78.0–100.0)
Platelets: 365 10*3/uL (ref 150–400)
RBC: 4.77 MIL/uL (ref 3.87–5.11)
RDW: 14.7 % (ref 11.5–15.5)
WBC: 7 10*3/uL (ref 4.0–10.5)

## 2017-05-25 LAB — BASIC METABOLIC PANEL
ANION GAP: 13 (ref 5–15)
BUN: 8 mg/dL (ref 6–20)
CO2: 26 mmol/L (ref 22–32)
Calcium: 9.1 mg/dL (ref 8.9–10.3)
Chloride: 97 mmol/L — ABNORMAL LOW (ref 101–111)
Creatinine, Ser: 1 mg/dL (ref 0.44–1.00)
GFR, EST NON AFRICAN AMERICAN: 57 mL/min — AB (ref >60)
Glucose, Bld: 111 mg/dL — ABNORMAL HIGH (ref 65–99)
POTASSIUM: 3.9 mmol/L (ref 3.5–5.1)
SODIUM: 136 mmol/L (ref 135–145)

## 2017-05-25 SURGERY — MRI WITH ANESTHESIA
Anesthesia: General

## 2017-05-25 MED ORDER — ONDANSETRON HCL 4 MG/2ML IJ SOLN: 4.0000 mg | Freq: Once | INTRAMUSCULAR | Status: DC | PRN

## 2017-05-25 MED ORDER — MEPERIDINE HCL 50 MG/ML IJ SOLN: 6.2500 mg | INTRAMUSCULAR | Status: DC | PRN

## 2017-05-25 MED ORDER — GADOBENATE DIMEGLUMINE 529 MG/ML IV SOLN
15.0000 mL | Freq: Once | INTRAVENOUS | Status: AC
  Administered 2017-05-25: 15 mL via INTRAVENOUS

## 2017-05-25 MED ORDER — LACTATED RINGERS IV SOLN
INTRAVENOUS | Status: DC
  Administered 2017-05-25: 09:00:00 via INTRAVENOUS

## 2017-05-25 MED ORDER — HYDROMORPHONE HCL 1 MG/ML IJ SOLN: 0.2500 mg | INTRAMUSCULAR | Status: DC | PRN

## 2017-05-25 NOTE — H&P (Signed)
Chief Complaint   No chief complaint on file.   HPI   HPI: Robin Collier is a 67 y.o. female who presents today for MRI under anesthesia due to chronic low back pain with radiculopathy.  Patient Active Problem List   Diagnosis Date Noted  . Diplopia 02/15/2017  . Common migraine with intractable migraine 02/15/2017  . Gait abnormality 02/15/2017  . Vertigo 02/15/2017  . Tremor, essential 02/15/2017  . ABDOMINAL BLOATING 07/30/2009  . MIGRAINE HEADACHE 07/29/2009  . GLAUCOMA 07/29/2009  . HYPERTENSION 07/29/2009  . UTI 07/29/2009  . VERTIGO 07/29/2009  . ANXIETY 07/23/2009  . GERD 07/23/2009  . DIVERTICULOSIS, COLON 07/23/2009  . HYPERLIPIDEMIA 10/11/2006  . DEPRESSION 10/11/2006  . IBS 10/11/2006  . DIVERTICULITIS, HX OF 10/11/2006    PMH: Past Medical History:  Diagnosis Date  . Anxiety    Panic attacks  . Arthritis   . Bipolar disorder [facility])    one Dr says yes, one says no  . Claustrophobia   . Common migraine with intractable migraine 02/15/2017   Chronic daily headache  . Constipation   . Depression   . Diplopia 02/15/2017  . Diverticulitis   . Fatty liver   . Gait abnormality 02/15/2017  . GERD (gastroesophageal reflux disease)   . Headache(784.0)    migraines  . Hypertension    Dr Shary Decamp told me that I do not have high blood preazaure and I am not on medication  . IBS (irritable bowel syndrome)   . Mental disorder   . Restless legs syndrome   . Seizures (HCC)    "not really seziure"  . Tremor, essential 02/15/2017  . Tremors of nervous system   . Vertigo    06/06/16-  last 1. 5 years    PSH: Past Surgical History:  Procedure Laterality Date  . ABDOMINAL HYSTERECTOMY  '90  . BACK SURGERY  2009   Discectomy  . BLADDER SUSPENSION    . COLONOSCOPY W/ POLYPECTOMY    . EYE SURGERY Bilateral    Cataract  . JOINT REPLACEMENT Right    Knee   . RADIOLOGY WITH ANESTHESIA N/A 06/07/2016   Procedure: MRI LUMBAR SPINE WITHOUT CONTRAST;   Surgeon: Medication Radiologist, MD;  Location: [facility] OR;  Service: Radiology;  Laterality: N/A;  . RADIOLOGY WITH ANESTHESIA N/A 11/17/2016   Procedure: MRI OF BRAIN WITH AND WITHOUT CONTRAST;  Surgeon: Radiologist, Medication, MD;  Location: [facility] OR;  Service: Radiology;  Laterality: N/A;    Medications Prior to Admission  Medication Sig Dispense Refill Last Dose  . acetaminophen (TYLENOL) 325 MG tablet Take 650 mg by mouth every 6 (six) hours as needed for mild pain or headache.   Past Week at Unknown time  . Cholecalciferol (VITAMIN D3) 1000 units CAPS Take 3,000 Units by mouth daily.   Past Week at Unknown time  . clonazePAM (KLONOPIN) 0.5 MG tablet Take 0.5 mg by mouth 2 (two) times daily.    05/25/2017 at Unknown time  . ketotifen (ZADITOR) 0.025 % ophthalmic solution Place 1 drop into both eyes 2 (two) times daily as needed (for dry eyes).   Past Week at Unknown time  . lamoTRIgine (LAMICTAL) 200 MG tablet Take 200 mg by mouth 2 (two) times daily.    05/25/2017 at Unknown time  . OVER THE COUNTER MEDICATION Take 1-2 tablets by mouth See admin instructions. Livco Supplement - Take 1 tablet by mouth in the morning and take 2 tablets by mouth in the evening   Past  Week at Unknown time  . Polyethyl Glycol-Propyl Glycol (SYSTANE OP) Place 2 drops into both eyes 2 (two) times daily.   Past Week at Unknown time  . propranolol ER (INDERAL LA) 60 MG 24 hr capsule Take 60 mg by mouth daily at 12 noon.   05/24/2017 at Unknown time  . pyridostigmine (MESTINON) 60 MG tablet Take 30 mg by mouth 3 (three) times daily.   05/25/2017 at Unknown time  . RABEprazole (ACIPHEX) 20 MG tablet Take 20 mg by mouth daily before breakfast.    05/25/2017 at Unknown time  . sertraline (ZOLOFT) 100 MG tablet Take 100 mg by mouth daily.    05/25/2017 at Unknown time  . traZODone (DESYREL) 100 MG tablet Take 300 mg by mouth at bedtime.    05/24/2017 at Unknown time  . cyanocobalamin (,VITAMIN B-12,) 1000 MCG/ML injection Inject 1,000 mcg  into the muscle every 30 (thirty) days.   More than a month at Unknown time  . dimenhyDRINATE (DRAMAMINE) 50 MG tablet Take 50 mg by mouth every 6 (six) hours as needed for itching.    More than a month at Unknown time    SH: Social History   Tobacco Use  . Smoking status: Never Smoker  . Smokeless tobacco: Never Used  Substance Use Topics  . Alcohol use: No  . Drug use: No    MEDS: Prior to Admission medications   Medication Sig Start Date End Date Taking? Authorizing Provider  acetaminophen (TYLENOL) 325 MG tablet Take 650 mg by mouth every 6 (six) hours as needed for mild pain or headache.   Yes [provider]  Cholecalciferol (VITAMIN D3) 1000 units CAPS Take 3,000 Units by mouth daily.   Yes [provider]  clonazePAM (KLONOPIN) 0.5 MG tablet Take 0.5 mg by mouth 2 (two) times daily.    Yes [provider]  ketotifen (ZADITOR) 0.025 % ophthalmic solution Place 1 drop into both eyes 2 (two) times daily as needed (for dry eyes).   Yes [provider]  lamoTRIgine (LAMICTAL) 200 MG tablet Take 200 mg by mouth 2 (two) times daily.    Yes [provider]  OVER THE COUNTER MEDICATION Take 1-2 tablets by mouth See admin instructions. Livco Supplement - Take 1 tablet by mouth in the morning and take 2 tablets by mouth in the evening   Yes [provider]  Polyethyl Glycol-Propyl Glycol (SYSTANE OP) Place 2 drops into both eyes 2 (two) times daily.   Yes [provider]  propranolol ER (INDERAL LA) 60 MG 24 hr capsule Take 60 mg by mouth daily at 12 noon.   Yes [provider]  pyridostigmine (MESTINON) 60 MG tablet Take 30 mg by mouth 3 (three) times daily.   Yes [provider]  RABEprazole (ACIPHEX) 20 MG tablet Take 20 mg by mouth daily before breakfast.    Yes [provider]  sertraline (ZOLOFT) 100 MG tablet Take 100 mg by mouth daily.    Yes [provider]  traZODone (DESYREL) 100 MG  tablet Take 300 mg by mouth at bedtime.    Yes [provider]  cyanocobalamin (,VITAMIN B-12,) 1000 MCG/ML injection Inject 1,000 mcg into the muscle every 30 (thirty) days.    [provider]  dimenhyDRINATE (DRAMAMINE) 50 MG tablet Take 50 mg by mouth every 6 (six) hours as needed for itching.     [provider]    ALLERGY: Allergies  Allergen Reactions  . Seroquel Doneen Poisson[Quetiapine  Fumarate] Nausea Only and Other (See Comments)    SEIZURE-LIKE RESPONSES VERTIGO  . Gabapentin Nausea And Vomiting and Other (See Comments)    Lightheadness  . Hydrocodone Other (See Comments)    Dizziness    Social History   Tobacco Use  . Smoking status: Never Smoker  . Smokeless tobacco: Never Used  Substance Use Topics  . Alcohol use: No     Family History  Problem Relation Age of Onset  . Cancer Mother   . Cancer Father   . Diabetes Father      ROS   ROS  Exam   Vitals:   05/25/17 0804  BP: (!) 119/56  Pulse: (!) 57  Resp: 20  Temp: 98.2 F (36.8 C)  SpO2: 98%     Results - Imaging/Labs   No results found for this or any previous visit (from the past 48 hour(s)).  No results found.   Impression/Plan   67 y.o. female who presents for MRI under anesthesia.

## 2017-05-25 NOTE — Anesthesia Postprocedure Evaluation (Signed)
Anesthesia Post Note  Patient: Robin Collier  Procedure(s) Performed: MRI WITH ANESTHESIA OF LUMBAR SPINE WITH AND WITHOUT (N/A )     Patient location during evaluation: PACU Anesthesia Type: General Level of consciousness: awake and alert Pain management: pain level controlled Vital Signs Assessment: post-procedure vital signs reviewed and stable Respiratory status: spontaneous breathing, nonlabored ventilation, respiratory function stable and patient connected to nasal cannula oxygen Cardiovascular status: blood pressure returned to baseline and stable Postop Assessment: no apparent nausea or vomiting Anesthetic complications: no    Last Vitals:  Vitals:   05/25/17 1201 05/25/17 1214  BP: 122/65 118/72  Pulse: 64 63  Resp: 15 14  Temp: 36.7 C 36.7 C  SpO2: 94% 94%    Last Pain:  Vitals:   05/25/17 1201  TempSrc:   PainSc: 5                  Tascha Casares DAVID

## 2017-05-25 NOTE — Transfer of Care (Signed)
Immediate Anesthesia Transfer of Care Note  Patient: Trilby Drummeratricia P Matteo  Procedure(s) Performed: MRI WITH ANESTHESIA OF LUMBAR SPINE WITH AND WITHOUT (N/A )  Patient Location: PACU  Anesthesia Type:General  Level of Consciousness: awake and patient cooperative  Airway & Oxygen Therapy: Patient Spontanous Breathing  Post-op Assessment: Report given to RN, Post -op Vital signs reviewed and stable and Patient moving all extremities  Post vital signs: Reviewed and stable  Last Vitals:  Vitals Value Taken Time  BP 133/64 05/25/2017 11:50 AM  Temp 36.7 C 05/25/2017 11:50 AM  Pulse 69 05/25/2017 11:54 AM  Resp 18 05/25/2017 11:54 AM  SpO2 93 % 05/25/2017 11:54 AM  Vitals shown include unvalidated device data.  Last Pain:  Vitals:   05/25/17 1150  TempSrc:   PainSc: 5       Patients Stated Pain Goal: 5 (05/25/17 0834)  Complications: No apparent anesthesia complications

## 2017-05-25 NOTE — Anesthesia Preprocedure Evaluation (Signed)
Anesthesia Evaluation  Patient identified by MRN, date of birth, ID band Patient awake    Reviewed: Allergy & Precautions, NPO status , Patient's Chart, lab work & pertinent test results  Airway Mallampati: I  TM Distance: >3 FB Neck ROM: Full    Dental   Pulmonary    Pulmonary exam normal        Cardiovascular hypertension, Pt. on medications Normal cardiovascular exam     Neuro/Psych Seizures -,  Anxiety Depression Bipolar Disorder    GI/Hepatic GERD  Medicated and Controlled,  Endo/Other    Renal/GU      Musculoskeletal   Abdominal   Peds  Hematology   Anesthesia Other Findings   Reproductive/Obstetrics                             Anesthesia Physical Anesthesia Plan  ASA: III  Anesthesia Plan: General   Post-op Pain Management:    Induction:   PONV Risk Score and Plan: 3 and Ondansetron and Treatment may vary due to age or medical condition  Airway Management Planned: LMA  Additional Equipment:   Intra-op Plan:   Post-operative Plan: Extubation in OR  Informed Consent: I have reviewed the patients History and Physical, chart, labs and discussed the procedure including the risks, benefits and alternatives for the proposed anesthesia with the patient or authorized representative who has indicated his/her understanding and acceptance.     Plan Discussed with: CRNA and Surgeon  Anesthesia Plan Comments:         Anesthesia Quick Evaluation

## 2017-05-26 ENCOUNTER — Encounter (HOSPITAL_COMMUNITY): Payer: Self-pay | Admitting: Radiology

## 2017-05-26 MED FILL — Lidocaine HCl Local Soln Prefilled Syringe 100 MG/5ML (2%): INTRAMUSCULAR | Qty: 5 | Status: AC

## 2017-05-26 MED FILL — Midazolam HCl Inj 2 MG/2ML (Base Equivalent): INTRAMUSCULAR | Qty: 2 | Status: AC

## 2017-05-26 MED FILL — Fentanyl Citrate Preservative Free (PF) Inj 100 MCG/2ML: INTRAMUSCULAR | Qty: 2 | Status: AC

## 2017-05-26 MED FILL — Propofol IV Emul 200 MG/20ML (10 MG/ML): INTRAVENOUS | Qty: 20 | Status: AC

## 2017-05-30 ENCOUNTER — Other Ambulatory Visit: Payer: Self-pay | Admitting: Neurosurgery

## 2017-06-09 NOTE — Pre-Procedure Instructions (Signed)
Trilby Drummeratricia P Rayson  06/09/2017      [id number] - [place], [place] - [address] [facility] [address] [place] [id number] Phone: [phone] Fax: [phone]    Your procedure is scheduled on Tuesday 06/20/17  [facility]  Call this number if you have problems the morning of surgery:  [phone]   Remember:  Do not eat food or drink liquids after midnight.  Take these medicines the morning of surgery with A SIP OF WATER - CLONAZEPAM (KLONOPIN), EYE DROPS, LAMICTAL, PROPRANOLOL (INDERAL), PYRIDOSTIGMINE (MESTINON), ACIPHEX, SERTRALINE (ZOLOFT)  7 days prior to surgery STOP taking any Aspirin(unless otherwise instructed by your surgeon), Aleve, Naproxen, Ibuprofen, Motrin, Advil, Goody's, BC's, all herbal medications, fish oil, and all vitamins   Do not wear jewelry, make-up or nail polish.  Do not wear lotions, powders, or perfumes, or deodorant.  Do not shave 48 hours prior to surgery.  Men may shave face and neck.  Do not bring valuables to the hospital.  [facility] is not responsible for any belongings or valuables.  Contacts, dentures or bridgework may not be worn into surgery.  Leave your suitcase in the car.  After surgery it may be brought to your room.  For patients admitted to the hospital, discharge time will be determined by your treatment team.  Patients discharged the day of surgery will not be allowed to drive home.   Name and phone number of your driver:    Special instructions:  [place] - Preparing for Surgery  Before surgery, you can play an important role.  Because skin is not sterile, your skin needs to be as free of germs as possible.  You can reduce the number of germs on you skin by washing with CHG (chlorahexidine gluconate) soap before surgery.  CHG is an antiseptic cleaner which kills germs and bonds with the skin to continue killing germs even after washing.  Please DO  NOT use if you have an allergy to CHG or antibacterial soaps.  If your skin becomes reddened/irritated stop using the CHG and inform your nurse when you arrive at Short Stay.  Do not shave (including legs and underarms) for at least 48 hours prior to the first CHG shower.  You may shave your face.  Please follow these instructions carefully:   1.  Shower with CHG Soap the night before surgery and the                                morning of Surgery.  2.  If you choose to wash your hair, wash your hair first as usual with your       normal shampoo.  3.  After you shampoo, rinse your hair and body thoroughly to remove the                      Shampoo.  4.  Use CHG as you would any other liquid soap.  You can apply chg directly       to the skin and wash gently with scrungie or a clean washcloth.  5.  Apply the CHG Soap to your body ONLY FROM THE NECK DOWN.        Do not use on open wounds or open sores.  Avoid contact with your eyes,  ears, mouth and genitals (private parts).  Wash genitals (private parts)       with your normal soap.  6.  Wash thoroughly, paying special attention to the area where your surgery        will be performed.  7.  Thoroughly rinse your body with warm water from the neck down.  8.  DO NOT shower/wash with your normal soap after using and rinsing off       the CHG Soap.  9.  Pat yourself dry with a clean towel.            10.  Wear clean pajamas.            11.  Place clean sheets on your bed the night of your first shower and do not        sleep with pets.  Day of Surgery  Do not apply any lotions/deoderants the morning of surgery.  Please wear clean clothes to the hospital/surgery center.     Please read over the following fact sheets that you were given. MRSA Information and Surgical Site Infection Prevention

## 2017-06-12 ENCOUNTER — Other Ambulatory Visit: Payer: Self-pay

## 2017-06-12 ENCOUNTER — Encounter (HOSPITAL_COMMUNITY): Payer: Self-pay

## 2017-06-12 ENCOUNTER — Encounter (HOSPITAL_COMMUNITY)
Admission: RE | Admit: 2017-06-12 | Discharge: 2017-06-12 | Disposition: A | Discharge status: Home / Self Care | Payer: Medicare Other | Source: Ambulatory Visit | Attending: Neurosurgery | Admitting: Neurosurgery

## 2017-06-12 DIAGNOSIS — Z01818 Encounter for other preprocedural examination: Secondary | ICD-10-CM | POA: Diagnosis present

## 2017-06-12 HISTORY — DX: Other spondylosis with radiculopathy, lumbosacral region: M47.27

## 2017-06-12 LAB — TYPE AND SCREEN
ABO/RH(D): O POS
Antibody Screen: NEGATIVE

## 2017-06-12 LAB — BASIC METABOLIC PANEL
ANION GAP: 12 (ref 5–15)
BUN: 6 mg/dL (ref 6–20)
CO2: 22 mmol/L (ref 22–32)
Calcium: 9.2 mg/dL (ref 8.9–10.3)
Chloride: 104 mmol/L (ref 101–111)
Creatinine, Ser: 0.88 mg/dL (ref 0.44–1.00)
GFR calc Af Amer: >60 mL/min (ref >60)
GLUCOSE: 106 mg/dL — AB (ref 65–99)
POTASSIUM: 3.5 mmol/L (ref 3.5–5.1)
Sodium: 138 mmol/L (ref 135–145)

## 2017-06-12 LAB — CBC
HEMATOCRIT: 39.5 % (ref 36.0–46.0)
HEMOGLOBIN: 13 g/dL (ref 12.0–15.0)
MCH: 29.3 pg (ref 26.0–34.0)
MCHC: 32.9 g/dL (ref 30.0–36.0)
MCV: 89.2 fL (ref 78.0–100.0)
Platelets: 328 10*3/uL (ref 150–400)
RBC: 4.43 MIL/uL (ref 3.87–5.11)
RDW: 14.1 % (ref 11.5–15.5)
WBC: 6.1 10*3/uL (ref 4.0–10.5)

## 2017-06-12 LAB — SURGICAL PCR SCREEN
MRSA, PCR: NEGATIVE
Staphylococcus aureus: NEGATIVE

## 2017-06-12 LAB — ABO/RH: ABO/RH(D): O POS

## 2017-06-12 NOTE — Progress Notes (Signed)
PCP - Dr. Shary DecampGrisso  Cardiologist - Dr. Tomie Chinaevankar  Chest x-ray - Denies  EKG - 11/17/16 (E)  Stress Test - 04/21/15 (CE)  ECHO - 08/25/05 (E)  Cardiac Cath - Denies  Sleep Study - Denies CPAP - None  LABS- 06/12/17: CBC, BMP, T/S   Anesthesia- No  Pt denies having chest pain, sob, or fever at this time. All instructions explained to the pt, with a verbal understanding of the material. Pt agrees to go over the instructions while at home for a better understanding. The opportunity to ask questions was provided.

## 2017-06-16 ENCOUNTER — Other Ambulatory Visit: Payer: Self-pay | Admitting: Neurosurgery

## 2017-06-19 ENCOUNTER — Encounter (HOSPITAL_COMMUNITY): Payer: Self-pay | Admitting: Certified Registered"

## 2017-06-20 ENCOUNTER — Inpatient Hospital Stay (HOSPITAL_COMMUNITY): Admission: RE | Admit: 2017-06-20 | Payer: Medicare Other | Source: Ambulatory Visit | Admitting: Neurosurgery

## 2017-06-20 ENCOUNTER — Other Ambulatory Visit: Payer: Self-pay | Admitting: Neurosurgery

## 2017-06-20 ENCOUNTER — Encounter (HOSPITAL_COMMUNITY): Admission: RE | Payer: Self-pay | Source: Ambulatory Visit

## 2017-06-20 SURGERY — POSTERIOR LUMBAR FUSION 1 LEVEL
Anesthesia: General

## 2017-07-05 ENCOUNTER — Other Ambulatory Visit: Payer: Self-pay

## 2017-07-05 ENCOUNTER — Encounter (HOSPITAL_COMMUNITY): Payer: Self-pay | Admitting: *Deleted

## 2017-07-05 NOTE — Progress Notes (Signed)
Spoke with pt's daughter, Fransico Michael for pre-op call. DPR on file. Pt was seen 3 weeks ago for a PAT appt. Kim states pt's medical and surgical history has not changed any. Pt is not diabetic and Selena Batten states pt has not c/o chest pain or sob. Pt takes Inderal for her tremors and takes Mestinon for her eyes and for vertigo. Selena Batten states pt has never been diagnosed with Myasthenia Gravis.

## 2017-07-06 ENCOUNTER — Encounter (HOSPITAL_COMMUNITY): Payer: Self-pay | Admitting: *Deleted

## 2017-07-06 ENCOUNTER — Other Ambulatory Visit: Payer: Self-pay | Admitting: Neurology

## 2017-07-06 ENCOUNTER — Inpatient Hospital Stay (HOSPITAL_COMMUNITY): Payer: Medicare Other | Admitting: Anesthesiology

## 2017-07-06 ENCOUNTER — Other Ambulatory Visit: Payer: Self-pay

## 2017-07-06 ENCOUNTER — Inpatient Hospital Stay (HOSPITAL_COMMUNITY): Payer: Medicare Other

## 2017-07-06 ENCOUNTER — Inpatient Hospital Stay (HOSPITAL_COMMUNITY)
Admission: RE | Disposition: A | Discharge status: Skilled Nursing Facility | Payer: Self-pay | Source: Ambulatory Visit | Attending: Neurosurgery

## 2017-07-06 ENCOUNTER — Inpatient Hospital Stay (HOSPITAL_COMMUNITY)
Admission: RE | Admit: 2017-07-06 | Discharge: 2017-07-11 | DRG: 460 | Disposition: A | Discharge status: Skilled Nursing Facility | Payer: Medicare Other | Source: Ambulatory Visit | Attending: Neurosurgery | Admitting: Neurosurgery

## 2017-07-06 DIAGNOSIS — G9741 Accidental puncture or laceration of dura during a procedure: Secondary | ICD-10-CM | POA: Diagnosis not present

## 2017-07-06 DIAGNOSIS — I10 Essential (primary) hypertension: Secondary | ICD-10-CM | POA: Diagnosis present

## 2017-07-06 DIAGNOSIS — Y834 Other reconstructive surgery as the cause of abnormal reaction of the patient, or of later complication, without mention of misadventure at the time of the procedure: Secondary | ICD-10-CM | POA: Diagnosis not present

## 2017-07-06 DIAGNOSIS — F410 Panic disorder [episodic paroxysmal anxiety] without agoraphobia: Secondary | ICD-10-CM | POA: Diagnosis present

## 2017-07-06 DIAGNOSIS — R269 Unspecified abnormalities of gait and mobility: Secondary | ICD-10-CM | POA: Diagnosis present

## 2017-07-06 DIAGNOSIS — N390 Urinary tract infection, site not specified: Secondary | ICD-10-CM | POA: Diagnosis not present

## 2017-07-06 DIAGNOSIS — D62 Acute posthemorrhagic anemia: Secondary | ICD-10-CM | POA: Diagnosis not present

## 2017-07-06 DIAGNOSIS — G8929 Other chronic pain: Secondary | ICD-10-CM | POA: Diagnosis present

## 2017-07-06 DIAGNOSIS — M4807 Spinal stenosis, lumbosacral region: Secondary | ICD-10-CM | POA: Diagnosis present

## 2017-07-06 DIAGNOSIS — Z419 Encounter for procedure for purposes other than remedying health state, unspecified: Secondary | ICD-10-CM

## 2017-07-06 DIAGNOSIS — Y92234 Operating room of hospital as the place of occurrence of the external cause: Secondary | ICD-10-CM | POA: Diagnosis not present

## 2017-07-06 DIAGNOSIS — M4726 Other spondylosis with radiculopathy, lumbar region: Secondary | ICD-10-CM | POA: Diagnosis present

## 2017-07-06 DIAGNOSIS — Z79899 Other long term (current) drug therapy: Secondary | ICD-10-CM

## 2017-07-06 DIAGNOSIS — G2581 Restless legs syndrome: Secondary | ICD-10-CM | POA: Diagnosis present

## 2017-07-06 DIAGNOSIS — M25552 Pain in left hip: Secondary | ICD-10-CM | POA: Diagnosis present

## 2017-07-06 DIAGNOSIS — H409 Unspecified glaucoma: Secondary | ICD-10-CM | POA: Diagnosis present

## 2017-07-06 DIAGNOSIS — E876 Hypokalemia: Secondary | ICD-10-CM | POA: Diagnosis not present

## 2017-07-06 DIAGNOSIS — G971 Other reaction to spinal and lumbar puncture: Secondary | ICD-10-CM | POA: Diagnosis not present

## 2017-07-06 DIAGNOSIS — R402414 Glasgow coma scale score 13-15, 24 hours or more after hospital admission: Secondary | ICD-10-CM | POA: Diagnosis not present

## 2017-07-06 DIAGNOSIS — M79605 Pain in left leg: Secondary | ICD-10-CM | POA: Diagnosis present

## 2017-07-06 DIAGNOSIS — M4727 Other spondylosis with radiculopathy, lumbosacral region: Secondary | ICD-10-CM | POA: Diagnosis present

## 2017-07-06 DIAGNOSIS — Z8669 Personal history of other diseases of the nervous system and sense organs: Secondary | ICD-10-CM

## 2017-07-06 DIAGNOSIS — M5416 Radiculopathy, lumbar region: Secondary | ICD-10-CM | POA: Diagnosis present

## 2017-07-06 DIAGNOSIS — K219 Gastro-esophageal reflux disease without esophagitis: Secondary | ICD-10-CM | POA: Diagnosis present

## 2017-07-06 DIAGNOSIS — M5127 Other intervertebral disc displacement, lumbosacral region: Secondary | ICD-10-CM | POA: Diagnosis present

## 2017-07-06 DIAGNOSIS — G250 Essential tremor: Secondary | ICD-10-CM | POA: Diagnosis present

## 2017-07-06 DIAGNOSIS — F319 Bipolar disorder, unspecified: Secondary | ICD-10-CM | POA: Diagnosis present

## 2017-07-06 DIAGNOSIS — Z5329 Procedure and treatment not carried out because of patient's decision for other reasons: Secondary | ICD-10-CM | POA: Diagnosis not present

## 2017-07-06 DIAGNOSIS — K760 Fatty (change of) liver, not elsewhere classified: Secondary | ICD-10-CM | POA: Diagnosis present

## 2017-07-06 DIAGNOSIS — R339 Retention of urine, unspecified: Secondary | ICD-10-CM | POA: Diagnosis not present

## 2017-07-06 DIAGNOSIS — Z885 Allergy status to narcotic agent status: Secondary | ICD-10-CM

## 2017-07-06 DIAGNOSIS — F40240 Claustrophobia: Secondary | ICD-10-CM | POA: Diagnosis present

## 2017-07-06 DIAGNOSIS — M4696 Unspecified inflammatory spondylopathy, lumbar region: Secondary | ICD-10-CM | POA: Diagnosis present

## 2017-07-06 DIAGNOSIS — M1990 Unspecified osteoarthritis, unspecified site: Secondary | ICD-10-CM | POA: Diagnosis present

## 2017-07-06 DIAGNOSIS — F419 Anxiety disorder, unspecified: Secondary | ICD-10-CM | POA: Diagnosis present

## 2017-07-06 DIAGNOSIS — Z96651 Presence of right artificial knee joint: Secondary | ICD-10-CM | POA: Diagnosis present

## 2017-07-06 DIAGNOSIS — Z888 Allergy status to other drugs, medicaments and biological substances status: Secondary | ICD-10-CM

## 2017-07-06 LAB — BASIC METABOLIC PANEL
ANION GAP: 11 (ref 5–15)
BUN: 8 mg/dL (ref 6–20)
CALCIUM: 9.4 mg/dL (ref 8.9–10.3)
CO2: 29 mmol/L (ref 22–32)
Chloride: 103 mmol/L (ref 101–111)
Creatinine, Ser: 1.02 mg/dL — ABNORMAL HIGH (ref 0.44–1.00)
GFR calc Af Amer: >60 mL/min (ref >60)
GFR, EST NON AFRICAN AMERICAN: 56 mL/min — AB (ref >60)
GLUCOSE: 110 mg/dL — AB (ref 65–99)
POTASSIUM: 3.6 mmol/L (ref 3.5–5.1)
SODIUM: 143 mmol/L (ref 135–145)

## 2017-07-06 LAB — CBC
HCT: 42.3 % (ref 36.0–46.0)
HEMOGLOBIN: 13.5 g/dL (ref 12.0–15.0)
MCH: 28.7 pg (ref 26.0–34.0)
MCHC: 31.9 g/dL (ref 30.0–36.0)
MCV: 89.8 fL (ref 78.0–100.0)
Platelets: 446 10*3/uL — ABNORMAL HIGH (ref 150–400)
RBC: 4.71 MIL/uL (ref 3.87–5.11)
RDW: 13.8 % (ref 11.5–15.5)
WBC: 7.4 10*3/uL (ref 4.0–10.5)

## 2017-07-06 LAB — TYPE AND SCREEN
ABO/RH(D): O POS
ANTIBODY SCREEN: NEGATIVE

## 2017-07-06 SURGERY — POSTERIOR LUMBAR FUSION 1 LEVEL
Anesthesia: General | Site: Back

## 2017-07-06 MED ORDER — SCOPOLAMINE 1 MG/3DAYS TD PT72
1.0000 | MEDICATED_PATCH | TRANSDERMAL | Status: DC
  Administered 2017-07-06: 1.5 mg via TRANSDERMAL
  Filled 2017-07-06: qty 1

## 2017-07-06 MED ORDER — DEXMEDETOMIDINE HCL IN NACL 200 MCG/50ML IV SOLN
INTRAVENOUS | Status: DC | PRN
  Administered 2017-07-06 (×3): 4 ug via INTRAVENOUS

## 2017-07-06 MED ORDER — DOCUSATE SODIUM 100 MG PO CAPS
100.0000 mg | ORAL_CAPSULE | Freq: Two times a day (BID) | ORAL | Status: DC
  Administered 2017-07-06 – 2017-07-11 (×10): 100 mg via ORAL
  Filled 2017-07-06 (×10): qty 1

## 2017-07-06 MED ORDER — ACETAMINOPHEN 650 MG RE SUPP: 650.0000 mg | RECTAL | Status: DC | PRN

## 2017-07-06 MED ORDER — THROMBIN (RECOMBINANT) 20000 UNITS EX SOLR
CUTANEOUS | Status: DC | PRN
  Administered 2017-07-06: 15:00:00 via TOPICAL

## 2017-07-06 MED ORDER — HYDROMORPHONE HCL 2 MG/ML IJ SOLN
0.2500 mg | INTRAMUSCULAR | Status: DC | PRN
  Administered 2017-07-06 (×2): 0.5 mg via INTRAVENOUS

## 2017-07-06 MED ORDER — THROMBIN (RECOMBINANT) 5000 UNITS EX SOLR
OROMUCOSAL | Status: DC | PRN
  Administered 2017-07-06: 15:00:00 via TOPICAL

## 2017-07-06 MED ORDER — KETAMINE HCL 10 MG/ML IJ SOLN
INTRAMUSCULAR | Status: AC
  Filled 2017-07-06: qty 1

## 2017-07-06 MED ORDER — EPHEDRINE SULFATE 50 MG/ML IJ SOLN
INTRAMUSCULAR | Status: DC | PRN
  Administered 2017-07-06 (×2): 10 mg via INTRAVENOUS
  Administered 2017-07-06: 15 mg via INTRAVENOUS

## 2017-07-06 MED ORDER — ACETAMINOPHEN 10 MG/ML IV SOLN
INTRAVENOUS | Status: DC | PRN
  Administered 2017-07-06: 1000 mg via INTRAVENOUS

## 2017-07-06 MED ORDER — KETAMINE HCL 10 MG/ML IJ SOLN
INTRAMUSCULAR | Status: DC | PRN
  Administered 2017-07-06 (×2): 20 mg via INTRAVENOUS
  Administered 2017-07-06: 30 mg via INTRAVENOUS
  Administered 2017-07-06: 20 mg via INTRAVENOUS

## 2017-07-06 MED ORDER — SODIUM CHLORIDE 0.9 % IV SOLN: 250.0000 mL | INTRAVENOUS | Status: DC

## 2017-07-06 MED ORDER — BISACODYL 10 MG RE SUPP: 10.0000 mg | Freq: Every day | RECTAL | Status: DC | PRN

## 2017-07-06 MED ORDER — MENTHOL 3 MG MT LOZG: 1.0000 | LOZENGE | OROMUCOSAL | Status: DC | PRN

## 2017-07-06 MED ORDER — THROMBIN 5000 UNITS EX SOLR
CUTANEOUS | Status: AC
  Filled 2017-07-06: qty 10000

## 2017-07-06 MED ORDER — CEFAZOLIN SODIUM-DEXTROSE 2-4 GM/100ML-% IV SOLN
2.0000 g | Freq: Three times a day (TID) | INTRAVENOUS | Status: AC
  Administered 2017-07-06 – 2017-07-07 (×2): 2 g via INTRAVENOUS
  Filled 2017-07-06 (×2): qty 100

## 2017-07-06 MED ORDER — SODIUM CHLORIDE 0.9% FLUSH: 3.0000 mL | INTRAVENOUS | Status: DC | PRN

## 2017-07-06 MED ORDER — HYDROMORPHONE HCL 2 MG/ML IJ SOLN
INTRAMUSCULAR | Status: AC
  Administered 2017-07-06: 0.5 mg via INTRAVENOUS
  Filled 2017-07-06: qty 1

## 2017-07-06 MED ORDER — SENNA 8.6 MG PO TABS
1.0000 | ORAL_TABLET | Freq: Two times a day (BID) | ORAL | Status: DC
  Administered 2017-07-06 – 2017-07-11 (×10): 8.6 mg via ORAL
  Filled 2017-07-06 (×9): qty 1

## 2017-07-06 MED ORDER — HEMOSTATIC AGENTS (NO CHARGE) OPTIME
TOPICAL | Status: DC | PRN
  Administered 2017-07-06: 1 via TOPICAL

## 2017-07-06 MED ORDER — METHOCARBAMOL 500 MG PO TABS
500.0000 mg | ORAL_TABLET | Freq: Four times a day (QID) | ORAL | Status: DC | PRN
  Administered 2017-07-06 – 2017-07-11 (×10): 500 mg via ORAL
  Filled 2017-07-06 (×10): qty 1

## 2017-07-06 MED ORDER — LACTATED RINGERS IV SOLN
INTRAVENOUS | Status: DC
Start: 2017-07-06 — End: 2017-07-11
  Administered 2017-07-06 (×2): via INTRAVENOUS

## 2017-07-06 MED ORDER — PROPOFOL 10 MG/ML IV BOLUS
INTRAVENOUS | Status: AC
  Filled 2017-07-06: qty 20

## 2017-07-06 MED ORDER — BUPIVACAINE HCL (PF) 0.5 % IJ SOLN
INTRAMUSCULAR | Status: DC | PRN
  Administered 2017-07-06: 4 mL

## 2017-07-06 MED ORDER — PHENYLEPHRINE 40 MCG/ML (10ML) SYRINGE FOR IV PUSH (FOR BLOOD PRESSURE SUPPORT)
PREFILLED_SYRINGE | INTRAVENOUS | Status: AC
  Filled 2017-07-06: qty 10

## 2017-07-06 MED ORDER — ONDANSETRON HCL 4 MG/2ML IJ SOLN
INTRAMUSCULAR | Status: AC
  Filled 2017-07-06: qty 2

## 2017-07-06 MED ORDER — SUGAMMADEX SODIUM 200 MG/2ML IV SOLN
INTRAVENOUS | Status: AC
  Filled 2017-07-06: qty 2

## 2017-07-06 MED ORDER — PHENOL 1.4 % MT LIQD: 1.0000 | OROMUCOSAL | Status: DC | PRN

## 2017-07-06 MED ORDER — SODIUM CHLORIDE 0.9 % IV SOLN
INTRAVENOUS | Status: DC | PRN
  Administered 2017-07-06: 15:00:00

## 2017-07-06 MED ORDER — EPHEDRINE SULFATE 50 MG/ML IJ SOLN
INTRAMUSCULAR | Status: AC
  Filled 2017-07-06: qty 1

## 2017-07-06 MED ORDER — LIDOCAINE 2% (20 MG/ML) 5 ML SYRINGE
INTRAMUSCULAR | Status: AC
  Filled 2017-07-06: qty 5

## 2017-07-06 MED ORDER — FLEET ENEMA 7-19 GM/118ML RE ENEM: 1.0000 | ENEMA | Freq: Once | RECTAL | Status: DC | PRN

## 2017-07-06 MED ORDER — MIDAZOLAM HCL 2 MG/2ML IJ SOLN
INTRAMUSCULAR | Status: AC
  Filled 2017-07-06: qty 2

## 2017-07-06 MED ORDER — LIDOCAINE-EPINEPHRINE 1 %-1:100000 IJ SOLN
INTRAMUSCULAR | Status: DC | PRN
  Administered 2017-07-06: 4 mL

## 2017-07-06 MED ORDER — BUPIVACAINE HCL (PF) 0.5 % IJ SOLN
INTRAMUSCULAR | Status: AC
  Filled 2017-07-06: qty 30

## 2017-07-06 MED ORDER — SUGAMMADEX SODIUM 200 MG/2ML IV SOLN
INTRAVENOUS | Status: DC | PRN
  Administered 2017-07-06: 160 mg via INTRAVENOUS

## 2017-07-06 MED ORDER — DEXAMETHASONE SODIUM PHOSPHATE 10 MG/ML IJ SOLN
INTRAMUSCULAR | Status: DC | PRN
  Administered 2017-07-06: 10 mg via INTRAVENOUS

## 2017-07-06 MED ORDER — ROCURONIUM BROMIDE 10 MG/ML (PF) SYRINGE
PREFILLED_SYRINGE | INTRAVENOUS | Status: DC | PRN
  Administered 2017-07-06 (×2): 50 mg via INTRAVENOUS

## 2017-07-06 MED ORDER — FENTANYL CITRATE (PF) 250 MCG/5ML IJ SOLN
INTRAMUSCULAR | Status: DC | PRN
  Administered 2017-07-06 (×3): 50 ug via INTRAVENOUS

## 2017-07-06 MED ORDER — FENTANYL CITRATE (PF) 250 MCG/5ML IJ SOLN
INTRAMUSCULAR | Status: AC
  Filled 2017-07-06: qty 5

## 2017-07-06 MED ORDER — OXYCODONE HCL 5 MG PO TABS
5.0000 mg | ORAL_TABLET | ORAL | Status: DC | PRN
  Administered 2017-07-07 (×3): 10 mg via ORAL
  Administered 2017-07-07 (×2): 5 mg via ORAL
  Administered 2017-07-10 (×2): 10 mg via ORAL
  Administered 2017-07-11: 5 mg via ORAL
  Filled 2017-07-06: qty 1
  Filled 2017-07-06: qty 2
  Filled 2017-07-06: qty 1
  Filled 2017-07-06: qty 2
  Filled 2017-07-06: qty 1
  Filled 2017-07-06 (×3): qty 2

## 2017-07-06 MED ORDER — PHENYLEPHRINE 40 MCG/ML (10ML) SYRINGE FOR IV PUSH (FOR BLOOD PRESSURE SUPPORT)
PREFILLED_SYRINGE | INTRAVENOUS | Status: DC | PRN
Start: 2017-07-06 — End: 2017-07-06
  Administered 2017-07-06 (×2): 80 ug via INTRAVENOUS
  Administered 2017-07-06: 40 ug via INTRAVENOUS
  Administered 2017-07-06: 80 ug via INTRAVENOUS

## 2017-07-06 MED ORDER — ONDANSETRON HCL 4 MG PO TABS: 4.0000 mg | ORAL_TABLET | Freq: Four times a day (QID) | ORAL | Status: DC | PRN

## 2017-07-06 MED ORDER — ALBUMIN HUMAN 5 % IV SOLN
INTRAVENOUS | Status: DC | PRN
  Administered 2017-07-06 (×2): via INTRAVENOUS

## 2017-07-06 MED ORDER — ACETAMINOPHEN 10 MG/ML IV SOLN
INTRAVENOUS | Status: AC
  Filled 2017-07-06: qty 100

## 2017-07-06 MED ORDER — ONDANSETRON HCL 4 MG/2ML IJ SOLN: 4.0000 mg | Freq: Four times a day (QID) | INTRAMUSCULAR | Status: DC | PRN

## 2017-07-06 MED ORDER — SODIUM CHLORIDE 0.9% FLUSH
3.0000 mL | Freq: Two times a day (BID) | INTRAVENOUS | Status: DC
  Administered 2017-07-06 – 2017-07-08 (×2): 3 mL via INTRAVENOUS

## 2017-07-06 MED ORDER — HYDROMORPHONE HCL 1 MG/ML IJ SOLN
0.5000 mg | INTRAMUSCULAR | Status: DC | PRN
  Administered 2017-07-09 – 2017-07-11 (×5): 1 mg via INTRAVENOUS
  Filled 2017-07-06 (×5): qty 1

## 2017-07-06 MED ORDER — GLYCOPYRROLATE 0.2 MG/ML IV SOSY
PREFILLED_SYRINGE | INTRAVENOUS | Status: DC | PRN
  Administered 2017-07-06 (×2): .2 mg via INTRAVENOUS

## 2017-07-06 MED ORDER — HYDROCODONE-ACETAMINOPHEN 5-325 MG PO TABS
1.0000 | ORAL_TABLET | ORAL | Status: DC | PRN
  Administered 2017-07-06 – 2017-07-09 (×7): 1 via ORAL
  Filled 2017-07-06 (×8): qty 1

## 2017-07-06 MED ORDER — CEFAZOLIN SODIUM-DEXTROSE 2-4 GM/100ML-% IV SOLN
2.0000 g | INTRAVENOUS | Status: AC
  Administered 2017-07-06: 2 g via INTRAVENOUS

## 2017-07-06 MED ORDER — DEXTROSE 5 % IV SOLN
INTRAVENOUS | Status: DC | PRN
  Administered 2017-07-06: 20 ug/min via INTRAVENOUS

## 2017-07-06 MED ORDER — METHOCARBAMOL 1000 MG/10ML IJ SOLN
500.0000 mg | Freq: Four times a day (QID) | INTRAVENOUS | Status: DC | PRN
  Filled 2017-07-06: qty 5

## 2017-07-06 MED ORDER — THROMBIN 20000 UNITS EX SOLR
CUTANEOUS | Status: AC
  Filled 2017-07-06: qty 20000

## 2017-07-06 MED ORDER — LIDOCAINE-EPINEPHRINE 1 %-1:100000 IJ SOLN
INTRAMUSCULAR | Status: AC
  Filled 2017-07-06: qty 1

## 2017-07-06 MED ORDER — 0.9 % SODIUM CHLORIDE (POUR BTL) OPTIME
TOPICAL | Status: DC | PRN
  Administered 2017-07-06: 1000 mL

## 2017-07-06 MED ORDER — SENNOSIDES-DOCUSATE SODIUM 8.6-50 MG PO TABS
1.0000 | ORAL_TABLET | Freq: Every evening | ORAL | Status: DC | PRN
  Filled 2017-07-06: qty 1

## 2017-07-06 MED ORDER — MIDAZOLAM HCL 5 MG/5ML IJ SOLN
INTRAMUSCULAR | Status: DC | PRN
  Administered 2017-07-06: 2 mg via INTRAVENOUS

## 2017-07-06 MED ORDER — PROMETHAZINE HCL 25 MG/ML IJ SOLN: 6.2500 mg | INTRAMUSCULAR | Status: DC | PRN

## 2017-07-06 MED ORDER — ACETAMINOPHEN 500 MG PO TABS
1000.0000 mg | ORAL_TABLET | Freq: Four times a day (QID) | ORAL | Status: AC
  Administered 2017-07-06 – 2017-07-07 (×3): 1000 mg via ORAL
  Filled 2017-07-06 (×4): qty 2

## 2017-07-06 MED ORDER — LIDOCAINE 2% (20 MG/ML) 5 ML SYRINGE
INTRAMUSCULAR | Status: DC | PRN
  Administered 2017-07-06: 100 mg via INTRAVENOUS

## 2017-07-06 MED ORDER — PROPOFOL 10 MG/ML IV BOLUS
INTRAVENOUS | Status: DC | PRN
  Administered 2017-07-06: 180 mg via INTRAVENOUS

## 2017-07-06 MED ORDER — CHLORHEXIDINE GLUCONATE CLOTH 2 % EX PADS: 6.0000 | MEDICATED_PAD | Freq: Once | CUTANEOUS | Status: DC

## 2017-07-06 MED ORDER — ACETAMINOPHEN 325 MG PO TABS
650.0000 mg | ORAL_TABLET | ORAL | Status: DC | PRN
  Administered 2017-07-07 – 2017-07-10 (×6): 650 mg via ORAL
  Filled 2017-07-06 (×6): qty 2

## 2017-07-06 MED ORDER — DEXAMETHASONE SODIUM PHOSPHATE 10 MG/ML IJ SOLN
INTRAMUSCULAR | Status: AC
  Filled 2017-07-06: qty 1

## 2017-07-06 MED ORDER — ROCURONIUM BROMIDE 50 MG/5ML IV SOLN
INTRAVENOUS | Status: AC
  Filled 2017-07-06: qty 1

## 2017-07-06 MED ORDER — SODIUM CHLORIDE 0.9 % IV SOLN: INTRAVENOUS | Status: DC

## 2017-07-06 SURGICAL SUPPLY — 83 items
BAG DECANTER FOR FLEXI CONT (MISCELLANEOUS) ×3 IMPLANT
BASKET BONE COLLECTION (BASKET) ×3 IMPLANT
BENZOIN TINCTURE PRP APPL 2/3 (GAUZE/BANDAGES/DRESSINGS) IMPLANT
BIT DRILL 3.5 POWEREASE (BIT) ×2 IMPLANT
BIT DRILL 3.5MM POWEREASE (BIT) ×1
BLADE CLIPPER SURG (BLADE) IMPLANT
BLADE SURG 11 STRL SS (BLADE) ×3 IMPLANT
BUR MATCHSTICK NEURO 3.0 LAGG (BURR) ×3 IMPLANT
BUR PRECISION FLUTE 5.0 (BURR) ×3 IMPLANT
CANISTER SUCT 3000ML PPV (MISCELLANEOUS) ×3 IMPLANT
CARTRIDGE OIL MAESTRO DRILL (MISCELLANEOUS) ×1 IMPLANT
CLOSURE WOUND 1/2 X4 (GAUZE/BANDAGES/DRESSINGS)
CONT SPEC 4OZ CLIKSEAL STRL BL (MISCELLANEOUS) ×3 IMPLANT
COVER BACK TABLE 60X90IN (DRAPES) ×3 IMPLANT
DECANTER SPIKE VIAL GLASS SM (MISCELLANEOUS) ×3 IMPLANT
DERMABOND ADVANCED (GAUZE/BANDAGES/DRESSINGS) ×2
DERMABOND ADVANCED .7 DNX12 (GAUZE/BANDAGES/DRESSINGS) ×1 IMPLANT
DIFFUSER DRILL AIR PNEUMATIC (MISCELLANEOUS) ×3 IMPLANT
DRAPE C-ARM 42X72 X-RAY (DRAPES) ×3 IMPLANT
DRAPE C-ARMOR (DRAPES) ×3 IMPLANT
DRAPE LAPAROTOMY 100X72X124 (DRAPES) ×3 IMPLANT
DRAPE SURG 17X23 STRL (DRAPES) ×3 IMPLANT
DRSG OPSITE POSTOP 4X6 (GAUZE/BANDAGES/DRESSINGS) ×3 IMPLANT
DURAPREP 26ML APPLICATOR (WOUND CARE) ×3 IMPLANT
ELECT REM PT RETURN 9FT ADLT (ELECTROSURGICAL) ×3
ELECTRODE REM PT RTRN 9FT ADLT (ELECTROSURGICAL) ×1 IMPLANT
GAUZE SPONGE 4X4 12PLY STRL (GAUZE/BANDAGES/DRESSINGS) IMPLANT
GAUZE SPONGE 4X4 16PLY XRAY LF (GAUZE/BANDAGES/DRESSINGS) IMPLANT
GLOVE BIO SURGEON STRL SZ7.5 (GLOVE) ×6 IMPLANT
GLOVE BIO SURGEON STRL SZ8 (GLOVE) ×3 IMPLANT
GLOVE BIOGEL PI IND STRL 7.5 (GLOVE) ×4 IMPLANT
GLOVE BIOGEL PI IND STRL 8 (GLOVE) ×4 IMPLANT
GLOVE BIOGEL PI IND STRL 8.5 (GLOVE) ×1 IMPLANT
GLOVE BIOGEL PI INDICATOR 7.5 (GLOVE) ×8
GLOVE BIOGEL PI INDICATOR 8 (GLOVE) ×8
GLOVE BIOGEL PI INDICATOR 8.5 (GLOVE) ×2
GLOVE ECLIPSE 7.0 STRL STRAW (GLOVE) ×6 IMPLANT
GLOVE ECLIPSE 7.5 STRL STRAW (GLOVE) ×12 IMPLANT
GLOVE EXAM NITRILE LRG STRL (GLOVE) IMPLANT
GLOVE EXAM NITRILE XL STR (GLOVE) IMPLANT
GLOVE EXAM NITRILE XS STR PU (GLOVE) IMPLANT
GOWN STRL REUS W/ TWL LRG LVL3 (GOWN DISPOSABLE) ×4 IMPLANT
GOWN STRL REUS W/ TWL XL LVL3 (GOWN DISPOSABLE) ×1 IMPLANT
GOWN STRL REUS W/TWL 2XL LVL3 (GOWN DISPOSABLE) ×6 IMPLANT
GOWN STRL REUS W/TWL LRG LVL3 (GOWN DISPOSABLE) ×8
GOWN STRL REUS W/TWL XL LVL3 (GOWN DISPOSABLE) ×2
GRAFT DURAGEN MATRIX 2WX2L ×3 IMPLANT
HEMOSTAT POWDER KIT SURGIFOAM (HEMOSTASIS) IMPLANT
HEMOSTAT POWDER SURGIFOAM 1G (HEMOSTASIS) ×3 IMPLANT
IV CATH AUTO 14GX1.75 SAFE ORG (IV SOLUTION) ×3 IMPLANT
KIT BASIN OR (CUSTOM PROCEDURE TRAY) ×3 IMPLANT
KIT INFUSE XX SMALL 0.7CC (Orthopedic Implant) ×3 IMPLANT
KIT POSITION SURG JACKSON T1 (MISCELLANEOUS) ×3 IMPLANT
KIT TURNOVER KIT B (KITS) ×3 IMPLANT
MILL MEDIUM DISP (BLADE) ×3 IMPLANT
NEEDLE HYPO 18GX1.5 BLUNT FILL (NEEDLE) IMPLANT
NEEDLE HYPO 22GX1.5 SAFETY (NEEDLE) ×3 IMPLANT
NEEDLE SPNL 18GX3.5 QUINCKE PK (NEEDLE) ×3 IMPLANT
NS IRRIG 1000ML POUR BTL (IV SOLUTION) ×3 IMPLANT
OIL CARTRIDGE MAESTRO DRILL (MISCELLANEOUS) ×3
PACK LAMINECTOMY NEURO (CUSTOM PROCEDURE TRAY) ×3 IMPLANT
PAD ARMBOARD 7.5X6 YLW CONV (MISCELLANEOUS) ×6 IMPLANT
PASTE BONE GRAFTON 5CC (Bone Implant) ×3 IMPLANT
PATTIES SURGICAL 1X1 (DISPOSABLE) ×3 IMPLANT
ROD CC 30MM (Rod) ×6 IMPLANT
SCREW 5.5X35MM (Screw) ×8 IMPLANT
SCREW BN 35X5.5XMA NS SPNE (Screw) ×4 IMPLANT
SCREW SET SOLERA (Screw) ×8 IMPLANT
SCREW SET SOLERA TI (Screw) ×4 IMPLANT
SEALANT ADHERUS EXTEND TIP (MISCELLANEOUS) ×3 IMPLANT
SPACER ARTIC-L 12X25X11 10D (Spacer) ×3 IMPLANT
SPONGE LAP 4X18 X RAY DECT (DISPOSABLE) IMPLANT
SPONGE SURGIFOAM ABS GEL 100 (HEMOSTASIS) ×3 IMPLANT
STRIP CLOSURE SKIN 1/2X4 (GAUZE/BANDAGES/DRESSINGS) IMPLANT
SUT VIC AB 0 CT1 18XCR BRD8 (SUTURE) ×1 IMPLANT
SUT VIC AB 0 CT1 8-18 (SUTURE) ×2
SUT VICRYL 3-0 RB1 18 ABS (SUTURE) ×3 IMPLANT
SYR 20CC LL (SYRINGE) ×3 IMPLANT
SYR 3ML LL SCALE MARK (SYRINGE) ×6 IMPLANT
TOWEL GREEN STERILE (TOWEL DISPOSABLE) ×3 IMPLANT
TOWEL GREEN STERILE FF (TOWEL DISPOSABLE) ×3 IMPLANT
TRAY FOLEY MTR SLVR 16FR STAT (SET/KITS/TRAYS/PACK) ×3 IMPLANT
WATER STERILE IRR 1000ML POUR (IV SOLUTION) ×3 IMPLANT

## 2017-07-06 NOTE — Op Note (Signed)
PREOP DIAGNOSIS:  1. Lumbago with radiculopathy 2. Spondylosis, L5-S1  POSTOP DIAGNOSIS: Same  PROCEDURE: 1. L5 laminectomy with facetectomy for decompression of exiting nerve roots, more than would be required for placement of interbody graft 2. Placement of anterior interbody device - Medtronic 11mm TLIF 10deg lordotic cage 3. Posterior non-segmental instrumentation using cortical pedicle screws at L5 - S1 4. Interbody arthrodesis, L5-S1 5. Use of locally harvested bone autograft 6. Use of non-structural bone allograft - BMP-2  SURGEON: Dr. Lisbeth Renshaw, MD  ASSISTANT: None  ANESTHESIA: General Endotracheal  EBL: 600cc  SPECIMENS: None  DRAINS: None  COMPLICATIONS: None immediate  CONDITION: Hemodynamically stable to PACU  HISTORY: Robin Collier is a 67 y.o. female who has been followed in the outpatient clinic with back and leg pain related to multilevel lumbar spondylosis.. She attempted multiple conservative treatments and ultimately elected to proceed with surgical decompression and fusion.  We did discuss the possibility of multilevel multistage lumbar decompression and fusion however she elected to proceed with single level surgery in an attempt to alleviate some of her pain.  Risks and benefits were reviewed and consent was obtained.  PROCEDURE IN DETAIL: After informed consent was obtained and witnessed, the patient was brought to the operating room. After induction of general anesthesia, the patient was positioned on the operative table in the prone position. All pressure points were meticulously padded. Incision was then marked out and prepped and draped in the usual sterile fashion.  After timeout was conducted, skin was infiltrated with local anesthetic. Skin incision was then made sharply and Bovie electrocautery was used to dissect the subcutaneous tissue until the lumbodorsal fascia was identified and incised. The muscle was then elevated in the  subperiosteal plane and the L5 and S1 lamina and facet complex were identified.  Self-retaining retractor was then placed.  I will position was confirmed with intraoperative lateral fluoroscopy.  At this point, using a combination of Rogers and the high-speed drill, laminectomy at L5 was completed.  The dura was noted to be extremely thin, and despite great care, it appeared that the dura was significantly adherent to the undersurface of the superior portion of the L5 lamina.  During the laminectomy of this portion of L5, a large durotomy was created which extended up under the inferior portion of L4 and was actually not entirely visible.  This was covered with a piece of Gelfoam for the remainder of the case and was addressed after placement of instrumentation.  After laminectomy, the inferior articular process of L5 bilaterally was removed by cutting the pars with a high-speed drill and Kerrison Rogers.  The traversing S1 nerve roots were identified as well as the medial and superior borders of the pedicle.  The remainder of the superior articulating process of S1 was then removed with a high-speed drill.  Disc space was identified bilaterally.  Good decompression of the nerve roots was confirmed by easy placement of a ball-tipped dissector out into the foramen as well as out into the S1 foramen.  At this point, the disc space was incised bilaterally, and using a combination of curettes, shavers, and rasps, the entire disc was removed.  The endplates were prepared by removal of the cartilaginous layer.  An 11 mm banana-shaped TLIF cage was then selected, packed with bone autograft harvested during decompression as well as BMP, and tapped into the interspace.  The inserting handle was then articulated, and the graft was seated in the very anterior portion of the interspace,  at the midline.  The disc space was then packed with the remaining bone autograft and BMP in order to achieve interbody  arthrodesis.  Using lateral fluoroscopy, entry points for bilateral L5 and S1 cortical pedicle screws were then identified, drilled, and tapped.  Screws were then placed measuring 5.5 x 35 mm at L5 and S1 bilaterally.  A 30 mm rod was then placed, set screws were placed and final tightened.  Final AP and lateral fluoroscopic images confirmed good location of the implanted hardware.  I then placed a piece of collagen DuraGen graft as an inlay as well as another piece as an onlay.  The dural defect was then covered with a layer of polyethylene glycol Adherus dural sealant.   The wound was then irrigated with copious amounts of normal saline irrigation.  The wound was closed in multiple layers with a combination of 0 and 3-0 Vicryl stitches.  Skin was closed with Dermabond.  After the Dermabond dried completely a sterile dressing was applied.  The patient was then transferred to the stretcher, extubated, and taken to the postanesthesia care unit in stable hemodynamic condition.  At the end of the case all sponge, needle, instrument, and cottonoid counts were correct.

## 2017-07-06 NOTE — H&P (Signed)
Chief Complaint   Back painn  HPI   HPI: Robin Collier is a 67 y.o. female with long standing history of chronic back pain.  She has undergone at least 3 prior laminectomies for right-sided pain, the last was several years ago.  She has been under the care of Dr. Ethelene Hal and has attempted multiple injections in her back for the left-sided leg pain.  Unfortunately she says that none of those injections have been helpful.  She continues to have lower back pain, with the worst pain in the left buttock and radiation down through the back of the thigh and calf to the ankle.  MRI L spine significant for disc degeneration with loss of height and right-sided lateral recess and foraminal disease at L2-3 and L3-4.  Of note, there is minimal left-sided lateral recess or foraminal stenosis.  There is bilateral facet arthropathy at L3-4 and to a lesser extent at L2-3.  At L4-5 there is auto fusion of the interspace.  At L5-S1 there is severe left-sided facet arthropathy with left eccentric broad-based disc protrusion and severe lateral recess and foraminal stenosis.  Treatment options at this point were discussed including continued conservative treatment.  Surgical options were also discussed including multilevel lumbar surgery possibly 2 states surgery to address L2-3 and L3-4 from a lateral approach followed by posterior decompression at L5-S1 with pedicle screws from L2-S1.  Alternatively, we did discuss addressing focally the L5-S1 level as above.She elected for fusion at L5-S1 rather than multilevel decompression and fusion.   Presents today for surgery. No concerns.  Patient Active Problem List   Diagnosis Date Noted  . Diplopia 02/15/2017  . Common migraine with intractable migraine 02/15/2017  . Gait abnormality 02/15/2017  . Vertigo 02/15/2017  . Tremor, essential 02/15/2017  . ABDOMINAL BLOATING 07/30/2009  . MIGRAINE HEADACHE 07/29/2009  . GLAUCOMA 07/29/2009  . HYPERTENSION 07/29/2009   . UTI 07/29/2009  . VERTIGO 07/29/2009  . ANXIETY 07/23/2009  . GERD 07/23/2009  . DIVERTICULOSIS, COLON 07/23/2009  . HYPERLIPIDEMIA 10/11/2006  . DEPRESSION 10/11/2006  . IBS 10/11/2006  . DIVERTICULITIS, HX OF 10/11/2006    PMH: Past Medical History:  Diagnosis Date  . Anxiety    Panic attacks  . Arthritis   . Bipolar disorder [facility])    one Dr says yes, one says no  . Claustrophobia   . Common migraine with intractable migraine 02/15/2017   Chronic daily headache  . Constipation   . Depression   . Diplopia 02/15/2017  . Diverticulitis   . Fatty liver   . Gait abnormality 02/15/2017  . GERD (gastroesophageal reflux disease)   . Headache(784.0)    migraines  . Hypertension    Dr Shary Decamp told me that I do not have high blood preazaure and I am not on medication  . IBS (irritable bowel syndrome)   . Lumbosacral spondylosis with radiculopathy   . Mental disorder   . Restless legs syndrome   . Seizures (HCC)    "not really seziure"  . Tremor, essential 02/15/2017  . Tremors of nervous system   . Vertigo    06/06/16-  last 1. 5 years    PSH: Past Surgical History:  Procedure Laterality Date  . ABDOMINAL HYSTERECTOMY  '90  . BACK SURGERY  2009   Discectomy  . BLADDER SUSPENSION    . COLONOSCOPY W/ POLYPECTOMY    . EYE SURGERY Bilateral    Cataract  . JOINT REPLACEMENT Right    Knee   .  RADIOLOGY WITH ANESTHESIA N/A 06/07/2016   Procedure: MRI LUMBAR SPINE WITHOUT CONTRAST;  Surgeon: Medication Radiologist, MD;  Location: [facility] OR;  Service: Radiology;  Laterality: N/A;  . RADIOLOGY WITH ANESTHESIA N/A 11/17/2016   Procedure: MRI OF BRAIN WITH AND WITHOUT CONTRAST;  Surgeon: Radiologist, Medication, MD;  Location: [facility] OR;  Service: Radiology;  Laterality: N/A;  . RADIOLOGY WITH ANESTHESIA N/A 05/25/2017   Procedure: MRI WITH ANESTHESIA OF LUMBAR SPINE WITH AND WITHOUT;  Surgeon: Radiologist, Medication, MD;  Location: [facility] OR;  Service: Radiology;  Laterality: N/A;     Medications Prior to Admission  Medication Sig Dispense Refill Last Dose  . Cholecalciferol (VITAMIN D3) 1000 units CAPS Take 1,000 Units by mouth 2 (two) times daily.    Past Month at Unknown time  . clonazePAM (KLONOPIN) 0.5 MG tablet Take 0.5 mg by mouth 2 (two) times daily.    07/06/2017 at 0700  . cyanocobalamin (,VITAMIN B-12,) 1000 MCG/ML injection Inject 1,000 mcg into the muscle every 30 (thirty) days.   Past Month at Unknown time  . ketotifen (ZADITOR) 0.025 % ophthalmic solution Place 2 drops into both eyes 2 (two) times daily as needed (allergies).    07/06/2017 at 0700  . lamoTRIgine (LAMICTAL) 200 MG tablet Take 200 mg by mouth 2 (two) times daily.    07/06/2017 at 0700  . Polyethyl Glycol-Propyl Glycol (SYSTANE OP) Place 2 drops into both eyes 3 (three) times daily as needed (dry eyes).    07/06/2017 at 0700  . propranolol ER (INDERAL LA) 60 MG 24 hr capsule Take 60 mg by mouth daily.    07/06/2017 at 0700  . RABEprazole (ACIPHEX) 20 MG tablet Take 20 mg by mouth daily before breakfast.    07/06/2017 at 0700  . sertraline (ZOLOFT) 100 MG tablet Take 100 mg by mouth daily.    07/06/2017 at 0700  . traZODone (DESYREL) 100 MG tablet Take 300 mg by mouth at bedtime.    07/05/2017 at Unknown time  . acetaminophen (TYLENOL) 500 MG tablet Take 1,000-1,500 mg by mouth 3 (three) times daily as needed for moderate pain.   Unknown at Unknown time  . OVER THE COUNTER MEDICATION Take 1-2 tablets by mouth See admin instructions. Livco Supplement - Take 1 tablet by mouth in the morning and take 2 tablets by mouth in the evening   More than a month at Unknown time    SH: Social History   Tobacco Use  . Smoking status: Never Smoker  . Smokeless tobacco: Never Used  Substance Use Topics  . Alcohol use: No  . Drug use: No    MEDS: Prior to Admission medications   Medication Sig Start Date End Date Taking? Authorizing Provider  Cholecalciferol (VITAMIN D3) 1000 units CAPS Take 1,000 Units by  mouth 2 (two) times daily.    Yes [provider]  clonazePAM (KLONOPIN) 0.5 MG tablet Take 0.5 mg by mouth 2 (two) times daily.    Yes [provider]  cyanocobalamin (,VITAMIN B-12,) 1000 MCG/ML injection Inject 1,000 mcg into the muscle every 30 (thirty) days.   Yes [provider]  ketotifen (ZADITOR) 0.025 % ophthalmic solution Place 2 drops into both eyes 2 (two) times daily as needed (allergies).    Yes [provider]  lamoTRIgine (LAMICTAL) 200 MG tablet Take 200 mg by mouth 2 (two) times daily.    Yes [provider]  Polyethyl Glycol-Propyl Glycol (SYSTANE OP) Place 2 drops into both eyes 3 (three) times daily  as needed (dry eyes).    Yes [provider]  propranolol ER (INDERAL LA) 60 MG 24 hr capsule Take 60 mg by mouth daily.    Yes [provider]  pyridostigmine (MESTINON) 60 MG tablet TAKE 0.5 TABLETS (30 MG TOTAL) BY MOUTH 3 (THREE) TIMES DAILY. 07/06/17  Yes York Spaniel, MD  RABEprazole (ACIPHEX) 20 MG tablet Take 20 mg by mouth daily before breakfast.    Yes [provider]  sertraline (ZOLOFT) 100 MG tablet Take 100 mg by mouth daily.    Yes [provider]  traZODone (DESYREL) 100 MG tablet Take 300 mg by mouth at bedtime.    Yes [provider]  acetaminophen (TYLENOL) 500 MG tablet Take 1,000-1,500 mg by mouth 3 (three) times daily as needed for moderate pain.    [provider]  OVER THE COUNTER MEDICATION Take 1-2 tablets by mouth See admin instructions. Livco Supplement - Take 1 tablet by mouth in the morning and take 2 tablets by mouth in the evening    [provider]    ALLERGY: Allergies  Allergen Reactions  . Seroquel [Quetiapine Fumarate] Nausea Only and Other (See Comments)    SEIZURE-LIKE RESPONSES VERTIGO  . Gabapentin Nausea And Vomiting and Other (See Comments)    Lightheadness  . Hydrocodone Other (See Comments)    Dizziness    Social  History   Tobacco Use  . Smoking status: Never Smoker  . Smokeless tobacco: Never Used  Substance Use Topics  . Alcohol use: No     Family History  Problem Relation Age of Onset  . Cancer Mother   . Cancer Father   . Diabetes Father      ROS   ROS  Exam   Vitals:   07/06/17 1036  BP: (!) 158/61  Pulse: (!) 57  Resp: 20  Temp: 98.1 F (36.7 C)  SpO2: 100%   General appearance: WDWN, NAD Eyes: PERRL, Fundoscopic: normal Cardiovascular: Regular rate and rhythm without murmurs, rubs, gallops. No edema or variciosities. Distal pulses normal. Pulmonary: Clear to auscultation Musculoskeletal:     Muscle tone upper extremities: Normal    Muscle tone lower extremities: Normal    Motor exam: Upper Extremities Deltoid Bicep Tricep Grip  Right 5/5 5/5 5/5 5/5  Left 5/5 5/5 5/5 5/5   Lower Extremity IP Quad PF DF EHL  Right 5/5 5/5 5/5 5/5 5/5  Left 5/5 5/5 5/5 5/5 5/5   Neurological Awake, alert, oriented Memory and concentration grossly intact Speech fluent, appropriate CNII: Visual fields normal CNIII/IV/VI: EOMI CNV: Facial sensation normal CNVII: Symmetric, normal strength CNVIII: Grossly normal CNIX: Normal palate movement CNXI: Trap and SCM strength normal CN XII: Tongue protrusion normal Sensation grossly intact to LT DTR: Normal Coordination (finger/nose & heel/shin): Normal  Results - Imaging/Labs   No results found for this or any previous visit (from the past 48 hour(s)).  No results found.  Impression/Plan   67 year old woman with back pain and left-sided buttock and leg pain.  While she does have multilevel spondylotic change, I do think that the majority of her left-sided pain is likely attributable to the L5-S1 level where there is severe facet arthropathy and left-sided lateral recess and foraminal stenosis.  She has failed reasonable medical and injection therapy. We will plan on decompression at L5-S1 which will require complete facetectomy  and diskectomy and therefore will also require fusion at this level with interbody device placement and posterior nonsegmental instrumentation with cortical  pedicle screws. While in the office, it was explained to the patient the details of the procedure, as well as the risks which include but are not limited to nerve root injury leading to leg or foot weakness/numbness and/or bowel and bladder dysfunction, CSF leak, bleeding, and infection.  Possible outcomes of surgery were also discussed including the possibility of persistence or worsening of pain symptoms and the possibility of accelerated adjacent level degeneration. The general risks of anesthesia were also reviewed including heart attack, stroke, and DVT/PE.  All questions answered. Consent signed.

## 2017-07-06 NOTE — Plan of Care (Signed)

## 2017-07-06 NOTE — Telephone Encounter (Signed)
Called daughter, Selena Batten (on [place]). Advised we received refill request for mestinon  (1/2 tablet TID). However, they previously called and thought it may have been causing a rash on her chest. Daughter believes her mother was taking more than prescribed dose and that is what caused the rash before. She has been on prescribed dose and tolerating well. They would like refill send if ok per MD. Advised I will send message to Dr. Anne Hahn.

## 2017-07-06 NOTE — Anesthesia Preprocedure Evaluation (Addendum)
Anesthesia Evaluation  Patient identified by MRN, date of birth, ID band Patient awake    Reviewed: Allergy & Precautions, NPO status , Patient's Chart, lab work & pertinent test results, reviewed documented beta blocker date and time   History of Anesthesia Complications Negative for: history of anesthetic complications  Airway Mallampati: I  TM Distance: >3 FB Neck ROM: Full    Dental no notable dental hx. (+) Dental Advisory Given   Pulmonary    Pulmonary exam normal        Cardiovascular hypertension, Pt. on home beta blockers Normal cardiovascular exam     Neuro/Psych Seizures -, Well Controlled,  PSYCHIATRIC DISORDERS Anxiety Depression Bipolar Disorder    GI/Hepatic Neg liver ROS, GERD  Medicated and Controlled,  Endo/Other  negative endocrine ROS  Renal/GU negative Renal ROS     Musculoskeletal   Abdominal   Peds  Hematology negative hematology ROS (+)   Anesthesia Other Findings   Reproductive/Obstetrics                             Anesthesia Physical  Anesthesia Plan  ASA: III  Anesthesia Plan: General   Post-op Pain Management:    Induction:   PONV Risk Score and Plan: 4 or greater and Ondansetron, Dexamethasone, Scopolamine patch - Pre-op and Diphenhydramine  Airway Management Planned: Oral ETT  Additional Equipment:   Intra-op Plan:   Post-operative Plan: Extubation in OR  Informed Consent: I have reviewed the patients History and Physical, chart, labs and discussed the procedure including the risks, benefits and alternatives for the proposed anesthesia with the patient or authorized representative who has indicated his/her understanding and acceptance.     Plan Discussed with: CRNA and Surgeon  Anesthesia Plan Comments: (Multimodal anesthetic with Ketamine)       Anesthesia Quick Evaluation

## 2017-07-06 NOTE — Transfer of Care (Signed)
Immediate Anesthesia Transfer of Care Note  Patient: Robin Collier  Procedure(s) Performed: LUMBAR FIVE- SACRAL ONE LAMINECTOMY WITH FACETECTOMY, INTERBODY ARTHRODESIS, NON-SEGMENTAL INSTRUMENTATION (N/A Back)  Patient Location: PACU  Anesthesia Type:General  Level of Consciousness: awake and patient cooperative  Airway & Oxygen Therapy: Patient Spontanous Breathing and Patient connected to face mask oxygen  Post-op Assessment: Report given to RN and Post -op Vital signs reviewed and stable  Post vital signs: Reviewed and stable  Last Vitals:  Vitals Value Taken Time  BP 125/51 07/06/2017  5:19 PM  Temp 36.3 C 07/06/2017  5:19 PM  Pulse 62 07/06/2017  5:23 PM  Resp 21 07/06/2017  5:23 PM  SpO2 96 % 07/06/2017  5:23 PM    Last Pain:  Vitals:   07/06/17 1036  TempSrc: Oral         Complications: No apparent anesthesia complications

## 2017-07-06 NOTE — Anesthesia Postprocedure Evaluation (Signed)
Anesthesia Post Note  Patient: Robin Collier  Procedure(s) Performed: LUMBAR FIVE- SACRAL ONE LAMINECTOMY WITH FACETECTOMY, INTERBODY ARTHRODESIS, NON-SEGMENTAL INSTRUMENTATION (N/A Back)     Patient location during evaluation: PACU Anesthesia Type: General Level of consciousness: sedated Pain management: pain level controlled Vital Signs Assessment: post-procedure vital signs reviewed and stable Respiratory status: spontaneous breathing and respiratory function stable Cardiovascular status: stable Postop Assessment: no apparent nausea or vomiting Anesthetic complications: no    Last Vitals:  Vitals:   07/06/17 1851 07/06/17 1919  BP:  136/68  Pulse:  63  Resp:  18  Temp: (!) 36.2 C 36.7 C  SpO2:  96%    Last Pain:  Vitals:   07/06/17 1919  TempSrc: Oral  PainSc:                  [name]

## 2017-07-06 NOTE — Anesthesia Procedure Notes (Signed)
Procedure Name: Intubation Date/Time: 07/06/2017 1:40 PM Performed by: Adria Dill, CRNA Pre-anesthesia Checklist: Patient identified, Emergency Drugs available, Suction available and Patient being monitored Patient Re-evaluated:Patient Re-evaluated prior to induction Oxygen Delivery Method: Circle system utilized Preoxygenation: Pre-oxygenation with 100% oxygen Induction Type: IV induction Ventilation: Mask ventilation without difficulty Laryngoscope Size: Miller and 2 Grade View: Grade I Tube type: Oral Tube size: 7.5 mm Number of attempts: 1 Airway Equipment and Method: Stylet Placement Confirmation: ETT inserted through vocal cords under direct vision,  positive ETCO2,  CO2 detector and breath sounds checked- equal and bilateral Secured at: 20 cm Tube secured with: Tape Dental Injury: Teeth and Oropharynx as per pre-operative assessment

## 2017-07-07 LAB — CBC
HCT: 32.6 % — ABNORMAL LOW (ref 36.0–46.0)
Hemoglobin: 10.5 g/dL — ABNORMAL LOW (ref 12.0–15.0)
MCH: 29.1 pg (ref 26.0–34.0)
MCHC: 32.2 g/dL (ref 30.0–36.0)
MCV: 90.3 fL (ref 78.0–100.0)
Platelets: 384 10*3/uL (ref 150–400)
RBC: 3.61 MIL/uL — ABNORMAL LOW (ref 3.87–5.11)
RDW: 13.7 % (ref 11.5–15.5)
WBC: 12.3 10*3/uL — ABNORMAL HIGH (ref 4.0–10.5)

## 2017-07-07 LAB — BASIC METABOLIC PANEL
Anion gap: 11 (ref 5–15)
BUN: 7 mg/dL (ref 6–20)
CO2: 28 mmol/L (ref 22–32)
Calcium: 8.9 mg/dL (ref 8.9–10.3)
Chloride: 100 mmol/L — ABNORMAL LOW (ref 101–111)
Creatinine, Ser: 1.11 mg/dL — ABNORMAL HIGH (ref 0.44–1.00)
GFR calc Af Amer: 58 mL/min — ABNORMAL LOW (ref >60)
GFR calc non Af Amer: 50 mL/min — ABNORMAL LOW (ref >60)
Glucose, Bld: 120 mg/dL — ABNORMAL HIGH (ref 65–99)
Potassium: 3.6 mmol/L (ref 3.5–5.1)
Sodium: 139 mmol/L (ref 135–145)

## 2017-07-07 LAB — PROTIME-INR
INR: 1.05
Prothrombin Time: 13.6 seconds (ref 11.4–15.2)

## 2017-07-07 LAB — APTT: aPTT: 27 seconds (ref 24–36)

## 2017-07-07 MED ORDER — SERTRALINE HCL 100 MG PO TABS
100.0000 mg | ORAL_TABLET | Freq: Every day | ORAL | Status: DC
  Administered 2017-07-07 – 2017-07-11 (×5): 100 mg via ORAL
  Filled 2017-07-07 (×4): qty 1
  Filled 2017-07-07: qty 2

## 2017-07-07 MED ORDER — PYRIDOSTIGMINE BROMIDE 60 MG PO TABS
30.0000 mg | ORAL_TABLET | Freq: Three times a day (TID) | ORAL | Status: DC
  Administered 2017-07-07 – 2017-07-11 (×12): 30 mg via ORAL
  Filled 2017-07-07 (×16): qty 0.5

## 2017-07-07 MED ORDER — TAMSULOSIN HCL 0.4 MG PO CAPS
0.4000 mg | ORAL_CAPSULE | Freq: Every day | ORAL | Status: DC
  Administered 2017-07-07 – 2017-07-11 (×5): 0.4 mg via ORAL
  Filled 2017-07-07 (×6): qty 1

## 2017-07-07 MED ORDER — PANTOPRAZOLE SODIUM 40 MG PO TBEC
40.0000 mg | DELAYED_RELEASE_TABLET | Freq: Every day | ORAL | Status: DC
Start: 2017-07-07 — End: 2017-07-11
  Administered 2017-07-07 – 2017-07-11 (×5): 40 mg via ORAL
  Filled 2017-07-07 (×5): qty 1

## 2017-07-07 MED ORDER — PROPRANOLOL HCL ER 60 MG PO CP24
60.0000 mg | ORAL_CAPSULE | Freq: Every day | ORAL | Status: DC
  Administered 2017-07-07 – 2017-07-11 (×5): 60 mg via ORAL
  Filled 2017-07-07 (×6): qty 1

## 2017-07-07 MED ORDER — LAMOTRIGINE 100 MG PO TABS
200.0000 mg | ORAL_TABLET | Freq: Two times a day (BID) | ORAL | Status: DC
  Administered 2017-07-07 – 2017-07-11 (×9): 200 mg via ORAL
  Filled 2017-07-07 (×10): qty 2

## 2017-07-07 MED ORDER — CLONAZEPAM 0.5 MG PO TABS
0.5000 mg | ORAL_TABLET | Freq: Two times a day (BID) | ORAL | Status: DC
  Administered 2017-07-07 – 2017-07-11 (×9): 0.5 mg via ORAL
  Filled 2017-07-07 (×9): qty 1

## 2017-07-07 MED ORDER — TRAZODONE HCL 100 MG PO TABS
300.0000 mg | ORAL_TABLET | Freq: Every day | ORAL | Status: DC
  Administered 2017-07-07 – 2017-07-10 (×4): 300 mg via ORAL
  Filled 2017-07-07: qty 3
  Filled 2017-07-07: qty 2
  Filled 2017-07-07 (×2): qty 3

## 2017-07-07 MED FILL — Thrombin For Soln 5000 Unit: CUTANEOUS | Qty: 5000 | Status: AC

## 2017-07-07 MED FILL — Thrombin For Soln 20000 Unit: CUTANEOUS | Qty: 1 | Status: AC

## 2017-07-07 NOTE — Evaluation (Signed)
Occupational Therapy Evaluation Patient Details Name: Robin Collier MRN: [id number] DOB: Mar 28, 1950 Today's Date: 07/07/2017    History of Present Illness Pt is a 67 y/o female who presents s/p L5-S1 PLIF on 07/06/17.    Clinical Impression   This 67 y/o female presents with the above. At baseline pt is mod independent with ADLs and functional mobility, though reports prior to surgery having increased difficulty completing both ADLs/mobility due to pain. Pt completing room level functional mobility using RW with MinA this session; pt with increased pain and increasingly diaphoretic after completing toileting ADLs this session, requiring return to supine for rest break. Pt requires MinA for toileting and standing grooming ADLs, ModA for LB ADLs secondary to adhering to back precautions. Pt requiring verbal safety cues for adhering to back precautions and safe RW use during functional tasks this session. Began education regarding back precautions, safety, AE and compensatory techniques for completing ADLs and functional transfers while adhering to precautions. Pt's daughter present during session and reporting a hired caregiver will be staying with pt through the weekend as pt typically lives alone. Pt will benefit from continued acute OT services and recommend follow-up HHOT services after discharge to maximize her overall safety and independence with ADLs and mobility after discharge home.     Follow Up Recommendations  Home health OT;Supervision/Assistance - 24 hour    Equipment Recommendations  3 in 1 bedside commode           Precautions / Restrictions Precautions Precautions: Fall;Back Precaution Booklet Issued: Yes (comment) Precaution Comments: Reviewed precautions verbally during functional mobility. Will need handout.  Required Braces or Orthoses: Spinal Brace Spinal Brace: Lumbar corset;Applied in sitting position Restrictions Weight Bearing Restrictions: No       Mobility Bed Mobility Overal bed mobility: Needs Assistance Bed Mobility: Rolling;Sidelying to Sit;Sit to Sidelying Rolling: Supervision Sidelying to sit: Min guard     Sit to sidelying: Mod assist General bed mobility comments: VC's for proper log roll technique as pt quick to attempt bed mobility without assist and not using proper technique. Pt was able to transition to EOB with increased time and close guard for safety. Pt required mod assist for LE elevation back up into bed when returning to sidelying and again requiring cues for initiating proper log roll technique   Transfers Overall transfer level: Needs assistance   Transfers: Sit to/from Stand Sit to Stand: Min assist         General transfer comment: assist to rise and steady at RW     Balance Overall balance assessment: Needs assistance Sitting-balance support: Feet supported;Bilateral upper extremity supported Sitting balance-Leahy Scale: Fair Sitting balance - Comments: able to maintain static sitting EOB   Standing balance support: Bilateral upper extremity supported;During functional activity;No upper extremity supported Standing balance-Leahy Scale: Fair Standing balance comment: able to maintain static standing without UE support; reliant on UE support during mobility                            ADL either performed or assessed with clinical judgement   ADL Overall ADL's : Needs assistance/impaired Eating/Feeding: Modified independent;Sitting   Grooming: Wash/dry hands;Min guard;Standing   Upper Body Bathing: Min guard;Sitting   Lower Body Bathing: Moderate assistance;Sit to/from stand   Upper Body Dressing : Minimal assistance;Sitting Upper Body Dressing Details (indicate cue type and reason): assist to don/doff spinal brace  Lower Body Dressing: Moderate assistance;Sit to/from stand Lower Body Dressing Details (  indicate cue type and reason): educated on use of reacher and sock aide for  LB ADLs with pt return demonstrating with minA (practicing while supine in bed due to pt's activity tolerance and feeling nauseous/lightheaded after ambulation to bathroom) Toilet Transfer: Minimal assistance;Ambulation;BSC;RW Toilet Transfer Details (indicate cue type and reason): BSC over toilet  Toileting- Clothing Manipulation and Hygiene: Minimal assistance;Sit to/from stand Toileting - Clothing Manipulation Details (indicate cue type and reason): educated on use of toilet aide for peri-care      Functional mobility during ADLs: Min guard;Minimal assistance;Rolling walker General ADL Comments: pt completing room level mobility to bathroom with MinGuard assist. After completion of toileting while standing to wash hands pt appearing increasingly diaphoretic and reports nausea/lighteadedness. MinA for ambulation back to EOB with vitals taken and stable; reviewed back precautions, provided education on AE, safety and compensatory techniques for completing ADLs while mostly supine in bed, will benefit from continued practice/review                          Pertinent Vitals/Pain Pain Assessment: Faces Pain Score: 10-Worst pain ever Faces Pain Scale: Hurts whole lot Pain Location: Back Pain Descriptors / Indicators: Operative site guarding;Discomfort;Grimacing Pain Intervention(s): Monitored during session;Limited activity within patient's tolerance;Repositioned     Extremity/Trunk Assessment Upper Extremity Assessment Upper Extremity Assessment: Generalized weakness   Lower Extremity Assessment Lower Extremity Assessment: Defer to PT evaluation   Cervical / Trunk Assessment Cervical / Trunk Assessment: Other exceptions Cervical / Trunk Exceptions: s/p surgery   Communication Communication Communication: No difficulties   Cognition Arousal/Alertness: Awake/alert Behavior During Therapy: Flat affect;Anxious;Impulsive Overall Cognitive Status: Impaired/Different from  baseline Area of Impairment: Safety/judgement                         Safety/Judgement: Decreased awareness of safety     General Comments: pt requires verbal safety cues during session including bed mobility and for safe RW use                     Home Living Family/patient expects to be discharged to:: Private residence Living Arrangements: Alone Available Help at Discharge: Family Type of Home: House Home Access: Stairs to enter Secretary/administrator of Steps: 3   Home Layout: One level     Bathroom Shower/Tub: Producer, television/film/video: Standard     Home Equipment: Environmental consultant - 4 wheels;Walker - 2 wheels;Gilmer Mor - single point   Additional Comments: pt's daughter reports they are hiring caregiver to stay with pt close to 24/7 until this Monday (07/10/17)      Prior Functioning/Environment Level of Independence: Independent with assistive device(s)        Comments: was using SPC/rollator for mobility         OT Problem List: Decreased strength;Impaired balance (sitting and/or standing);Decreased cognition;Decreased knowledge of precautions;Pain;Decreased safety awareness;Decreased knowledge of use of DME or AE;Decreased activity tolerance      OT Treatment/Interventions: Self-care/ADL training;DME and/or AE instruction;Therapeutic activities;Balance training;Therapeutic exercise;Patient/family education    OT Goals(Current goals can be found in the care plan section) Acute Rehab OT Goals Patient Stated Goal: Decrease pain OT Goal Formulation: With patient Time For Goal Achievement: 07/21/17 Potential to Achieve Goals: Good  OT Frequency: Min 2X/week                             AM-PAC PT "6  Clicks" Daily Activity     Outcome Measure Help from another person eating meals?: None Help from another person taking care of personal grooming?: A Little Help from another person toileting, which includes using toliet, bedpan, or urinal?: A  Little Help from another person bathing (including washing, rinsing, drying)?: A Lot Help from another person to put on and taking off regular upper body clothing?: A Little Help from another person to put on and taking off regular lower body clothing?: A Lot 6 Click Score: 17   End of Session Equipment Utilized During Treatment: Gait belt;Rolling walker Nurse Communication: Mobility status  Activity Tolerance: Patient tolerated treatment well;Patient limited by pain Patient left: in bed;with call bell/phone within reach;with family/visitor present  OT Visit Diagnosis: Other abnormalities of gait and mobility (R26.89);Pain;Unsteadiness on feet (R26.81) Pain - part of body: (back )                Time: 1610-9604 OT Time Calculation (min): 41 min Charges:  OT General Charges $OT Visit: 1 Visit OT Evaluation $OT Eval Low Complexity: 1 Low OT Treatments $Self Care/Home Management : 23-37 mins G-Codes:     Marcy Siren, OT Pager [phone] 07/07/2017   Orlando Penner 07/07/2017, 11:59 AM

## 2017-07-07 NOTE — Plan of Care (Signed)
  Problem: Pain Managment: Goal: General experience of comfort will improve Outcome: Progressing   Problem: Elimination: Goal: Will not experience complications related to bowel motility Outcome: Progressing   

## 2017-07-07 NOTE — Evaluation (Signed)
Physical Therapy Evaluation Patient Details Name: Robin Collier MRN: [id number] DOB: 01/19/1951 Today's Date: 07/07/2017   History of Present Illness  Pt is a 67 y/o female who presents s/p L5-S1 PLIF on 07/06/17.   Clinical Impression  Pt admitted with above diagnosis. Pt currently with functional limitations due to the deficits listed below (see PT Problem List). At the time of PT eval pt was able to perform transfers to/from EOB only due to pain. Pt reporting 10/10 pain sitting EOB and reports she cannot attempt OOB at this time - asking to lay back down. When PT arrived, pt laying in bed with spine bent laterally and lumbar area twisted. Pt educated on proper positioning recommendations. Pt will need to be able to demonstrate safe mobility and stair negotiation prior to d/c as she lives alone. Unsure how long daughter will be available for assistance. Pt will benefit from skilled PT to increase their independence and safety with mobility to allow discharge to the venue listed below.       Follow Up Recommendations Home health PT;Supervision for mobility/OOB    Equipment Recommendations  Rolling walker with 5" wheels;3in1 (PT)    Recommendations for Other Services       Precautions / Restrictions Precautions Precautions: Fall;Back Precaution Booklet Issued: Yes (comment) Precaution Comments: Reviewed precautions verbally during functional mobility. Will need handout.  Required Braces or Orthoses: Spinal Brace Spinal Brace: Lumbar corset;Applied in sitting position Restrictions Weight Bearing Restrictions: No      Mobility  Bed Mobility Overal bed mobility: Needs Assistance Bed Mobility: Rolling;Sidelying to Sit;Sit to Sidelying Rolling: Supervision Sidelying to sit: Min guard     Sit to sidelying: Min assist General bed mobility comments: VC's for proper log roll technique. Pt was able to transition to EOB with increased time and close guard for safety. Pt required min  assist for LE elevation back up into bed at end of session.  Transfers                 General transfer comment: Pt asking to lay back down once sitting EOB. She states she is 10/10 pain and is not able to tolerate attempting OOB at this time.   Ambulation/Gait                Stairs            Wheelchair Mobility    Modified Rankin (Stroke Patients Only)       Balance Overall balance assessment: Needs assistance Sitting-balance support: Feet supported;Bilateral upper extremity supported Sitting balance-Leahy Scale: Poor Sitting balance - Comments: Pt was not able to elevate one UE up to don gown without increase in pain and LOB.                                      Pertinent Vitals/Pain Pain Assessment: 0-10 Pain Score: 10-Worst pain ever Pain Location: Back Pain Descriptors / Indicators: Operative site guarding;Discomfort;Grimacing Pain Intervention(s): Limited activity within patient's tolerance;Monitored during session;Repositioned    Home Living Family/patient expects to be discharged to:: Private residence Living Arrangements: Alone Available Help at Discharge: Family Type of Home: House Home Access: Stairs to enter   Secretary/administrator of Steps: 3 Home Layout: One level        Prior Function                 Hand Dominance  Extremity/Trunk Assessment   Upper Extremity Assessment Upper Extremity Assessment: Defer to OT evaluation    Lower Extremity Assessment Lower Extremity Assessment: Generalized weakness    Cervical / Trunk Assessment Cervical / Trunk Assessment: Other exceptions Cervical / Trunk Exceptions: s/p surgery  Communication   Communication: No difficulties  Cognition Arousal/Alertness: Awake/alert Behavior During Therapy: Flat affect Overall Cognitive Status: Within Functional Limits for tasks assessed                                        General Comments       Exercises     Assessment/Plan    PT Assessment Patient needs continued PT services  PT Problem List Decreased strength;Decreased range of motion;Decreased activity tolerance;Decreased balance;Decreased mobility;Decreased knowledge of use of DME;Decreased safety awareness;Decreased knowledge of precautions;Pain       PT Treatment Interventions DME instruction;Gait training;Stair training;Functional mobility training;Therapeutic activities;Therapeutic exercise;Neuromuscular re-education;Patient/family education    PT Goals (Current goals can be found in the Care Plan section)  Acute Rehab PT Goals Patient Stated Goal: Decrease pain PT Goal Formulation: With patient/family Time For Goal Achievement: 07/14/17 Potential to Achieve Goals: Good    Frequency Min 5X/week   Barriers to discharge        Co-evaluation               AM-PAC PT "6 Clicks" Daily Activity  Outcome Measure Difficulty turning over in bed (including adjusting bedclothes, sheets and blankets)?: Unable Difficulty moving from lying on back to sitting on the side of the bed? : Unable Difficulty sitting down on and standing up from a chair with arms (e.g., wheelchair, bedside commode, etc,.)?: Unable Help needed moving to and from a bed to chair (including a wheelchair)?: Total Help needed walking in hospital room?: Total Help needed climbing 3-5 steps with a railing? : Total 6 Click Score: 6    End of Session   Activity Tolerance: Patient limited by pain Patient left: in bed;with call bell/phone within reach;with nursing/sitter in room;with family/visitor present Nurse Communication: Mobility status PT Visit Diagnosis: Pain;Other symptoms and signs involving the nervous system (R29.898) Pain - part of body: (back)    Time: 6962-9528 PT Time Calculation (min) (ACUTE ONLY): 10 min   Charges:   PT Evaluation $PT Eval Moderate Complexity: 1 Mod     PT G Codes:        Conni Slipper, PT,  DPT Acute Rehabilitation Services Pager: [phone]   Marylynn Pearson 07/07/2017, 10:16 AM

## 2017-07-07 NOTE — Progress Notes (Signed)
Physical Therapy Treatment Patient Details Name: Robin Collier MRN: [id number] DOB: 1950-07-16 Today's Date: 07/07/2017    History of Present Illness Pt is a 67 y/o female who presents s/p L5-S1 PLIF on 07/06/17.     PT Comments    Second session due to limited mobility and pain on eval. Pt required increased encouragement to participate. Upon entry, pt asking "Do we have to get up?" Prior to session pt had not been ambulating in hall this morning. Pain continues to be the main limiting factor and we were not able to initiate stair training this session. Overall tolerance for functional activity is low and safety awareness is significantly decreased. Feel this patient would benefit from at least one more therapy session prior to d/c to maximize safety and functional independence at home.   Follow Up Recommendations  Home health PT;Supervision for mobility/OOB     Equipment Recommendations  Rolling walker with 5" wheels;3in1 (PT)    Recommendations for Other Services       Precautions / Restrictions Precautions Precautions: Fall;Back Precaution Booklet Issued: Yes (comment) Precaution Comments: Reviewed precautions verbally during functional mobility. Will need handout.  Required Braces or Orthoses: Spinal Brace Spinal Brace: Lumbar corset;Applied in sitting position Restrictions Weight Bearing Restrictions: No    Mobility  Bed Mobility Overal bed mobility: Needs Assistance Bed Mobility: Rolling;Sidelying to Sit;Sit to Sidelying Rolling: Supervision Sidelying to sit: Min guard     Sit to sidelying: Min assist General bed mobility comments: VC's for proper log roll technique as pt quick to attempt bed mobility without assist and not using proper technique. Pt was able to transition to EOB with increased time and close guard for safety. Pt required min assist for LE elevation back up into bed when returning to sidelying and again requiring cues for initiating proper log  roll technique   Transfers Overall transfer level: Needs assistance Equipment used: Rolling walker (2 wheeled) Transfers: Sit to/from Stand Sit to Stand: Min assist         General transfer comment: assist to rise and steady at RW. Pt initially attempting to use walker to push up to stand when it was not positioned in front of her and was off to the side. Decreased safety awareness.   Ambulation/Gait Ambulation/Gait assistance: Min guard Ambulation Distance (Feet): 175 Feet Assistive device: Rolling walker (2 wheeled) Gait Pattern/deviations: Step-through pattern;Decreased stride length;Trunk flexed Gait velocity: Decreased Gait velocity interpretation: <1.8 ft/sec, indicate of risk for recurrent falls General Gait Details: VC's for improved posture. Pt with complaints of increased pain with OOB. Overall moving very slowly however when getting close to the room again pt rushing to get to bed to sit down. Refused attempting recliner.    Stairs             Wheelchair Mobility    Modified Rankin (Stroke Patients Only)       Balance Overall balance assessment: Needs assistance Sitting-balance support: Feet supported;Bilateral upper extremity supported Sitting balance-Leahy Scale: Poor Sitting balance - Comments: Difficulty sitting without UE support due to pain   Standing balance support: Bilateral upper extremity supported;During functional activity;No upper extremity supported Standing balance-Leahy Scale: Poor Standing balance comment: Reliant on UE support due to pain                            Cognition Arousal/Alertness: Awake/alert Behavior During Therapy: Flat affect;Anxious;Impulsive Overall Cognitive Status: Impaired/Different from baseline Area of Impairment: Safety/judgement  Safety/Judgement: Decreased awareness of safety     General Comments: pt requires verbal safety cues during session including bed  mobility and for safe RW use       Exercises      General Comments        Pertinent Vitals/Pain Pain Assessment: Faces Pain Score: 10-Worst pain ever Faces Pain Scale: Hurts whole lot Pain Location: Back Pain Descriptors / Indicators: Operative site guarding;Discomfort;Grimacing Pain Intervention(s): Monitored during session    Home Living Family/patient expects to be discharged to:: Private residence Living Arrangements: Alone Available Help at Discharge: Family Type of Home: House Home Access: Stairs to enter   Home Layout: One level Home Equipment: Environmental consultant - 4 wheels;Walker - 2 wheels;Gilmer Mor - single point Additional Comments: pt's daughter reports they are hiring caregiver to stay with pt close to 24/7 until this Monday (07/10/17)    Prior Function Level of Independence: Independent with assistive device(s)      Comments: was using SPC/rollator for mobility    PT Goals (current goals can now be found in the care plan section) Acute Rehab PT Goals Patient Stated Goal: Decrease pain PT Goal Formulation: With patient/family Time For Goal Achievement: 07/14/17 Potential to Achieve Goals: Good Progress towards PT goals: Progressing toward goals    Frequency    Min 5X/week      PT Plan Current plan remains appropriate    Co-evaluation              AM-PAC PT "6 Clicks" Daily Activity  Outcome Measure  Difficulty turning over in bed (including adjusting bedclothes, sheets and blankets)?: Unable Difficulty moving from lying on back to sitting on the side of the bed? : Unable Difficulty sitting down on and standing up from a chair with arms (e.g., wheelchair, bedside commode, etc,.)?: Unable Help needed moving to and from a bed to chair (including a wheelchair)?: A Little Help needed walking in hospital room?: A Little Help needed climbing 3-5 steps with a railing? : Total 6 Click Score: 10    End of Session Equipment Utilized During Treatment: Gait  belt;Back brace Activity Tolerance: Patient limited by pain Patient left: in bed;with call bell/phone within reach;with family/visitor present Nurse Communication: Mobility status PT Visit Diagnosis: Pain;Other symptoms and signs involving the nervous system (R29.898) Pain - part of body: (back)     Time: 9811-9147 PT Time Calculation (min) (ACUTE ONLY): 22 min  Charges:  $Gait Training: 8-22 mins                    G Codes:       Conni Slipper, PT, DPT Acute Rehabilitation Services Pager: [phone]    Marylynn Pearson 07/07/2017, 12:58 PM

## 2017-07-07 NOTE — Care Management Note (Signed)
Case Management Note  Patient Details  Name: Robin Collier MRN: [id number] Date of Birth: Feb 10, 1951  Subjective/Objective:  67 yr old female s/p L5-S1PLIF.                  Action/Plan: Case manager spoke with patient concerning discharge plan. Choice for Home Health agency was offered, referral was called to Katharina Caper, [facility] Liaison. Patient says she will have family support at discharge.    Expected Discharge Date:    07/07/17              Expected Discharge Plan:  Home w Home Health Services  In-House Referral:     Discharge planning Services  CM Consult  Post Acute Care Choice:  Home Health, Durable Medical Equipment Choice offered to:  Patient  DME Arranged:  3-N-1, Walker rolling DME Agency:  [facility]  HH Arranged:  PT, OT [facility] Agency:  [facility]  Status of Service:  Completed, signed off  If discussed at Long Length of Stay Meetings, dates discussed:    Additional Comments:  Durenda Guthrie, RN 07/07/2017, 3:06 PM

## 2017-07-07 NOTE — Progress Notes (Signed)
  NEUROSURGERY PROGRESS NOTE   No issues overnight. Pt report left ankle pain and headache/neck pain. Has walked in the hallway twice today.  EXAM:  BP 140/69 (BP Location: Right Arm)   Pulse 70   Temp 98.8 F (37.1 C) (Oral)   Resp 18   Ht  (1.6 m)   Wt 79.1 kg (174 lb 6.4 oz)   SpO2 96%   BMI 30.89 kg/m   Awake, alert, oriented  Speech fluent, appropriate  CN grossly intact  5/5 BUE/BLE  Wound c/d/i, no leak  IMPRESSION:  67 y.o. female POD#1 s/p L5-S1 PLIF. Suspect HA from intraoperative durotomy, no evidence of cutaneous CSF fistula.  PLAN: - Cont to ambulate as tolerated - Hopefully home tomorrow

## 2017-07-08 LAB — URINALYSIS, ROUTINE W REFLEX MICROSCOPIC
GLUCOSE, UA: NEGATIVE mg/dL
Ketones, ur: 20 mg/dL — AB
Nitrite: NEGATIVE
PH: 5 (ref 5.0–8.0)
Protein, ur: 100 mg/dL — AB
SPECIFIC GRAVITY, URINE: 1.025 (ref 1.005–1.030)

## 2017-07-08 NOTE — Progress Notes (Signed)
At 2316, pt's is febrile with temperature of 101 orally. She was encouraged to use the spirometer and Tylenol 650 mg tabs were given at 2330; at 0051 temp was rechecked again and remained unchanged (101.1). Twice it was rechecked an hour after and trend down to 100.2 and 100.3 orally. At 0051, NT went to pt's room to check and found her dangling on the side of the bed, feet on the floor and upper body on the bed. When asked what she intends to do, she appears to be confused and unaware of what she wants to do. Attempted twice to assist her to walk to the bathroom with another RN, but each time we stands her up, she would let herself fall back to bed and complains she is hurting and could not do it. We bladder scanned her with 440 cc of retained urine. Of note at shift change at 1917, she had an output of 1250 cc urine from straight cath. We placed a foley catheter due to her urinary retention, of which 600 cc of yellow, clear urine came out. Pt felt relieved after the foley placement. At 0230, pt was rechecked in her room and was sleeping comfortably. Will continue to monitor pt.

## 2017-07-08 NOTE — Progress Notes (Addendum)
Physical Therapy Treatment Patient Details Name: Robin Collier MRN: [id number] DOB: 1950/05/03 Today's Date: 07/08/2017    History of Present Illness Pt is a 67 y/o female who presents s/p L5-S1 PLIF on 07/06/17.     PT Comments    Patient seen for activity progression. Limited by pain and confusion today. Poor ability to follow precautions and navigate safely with RW. Concerned regarding patient's overall activity tolerance, may need to consider ST SNF. Patient requesting pain medicine, nursing notified.   Follow Up Recommendations  SNF;Supervision for mobility/OOB     Equipment Recommendations  Rolling walker with 5" wheels;3in1 (PT)    Recommendations for Other Services       Precautions / Restrictions Precautions Precautions: Fall;Back Precaution Booklet Issued: Yes (comment) Precaution Comments: Reviewed precautions verbally during functional mobility. Will need handout.  Required Braces or Orthoses: Spinal Brace Spinal Brace: Lumbar corset;Applied in sitting position Restrictions Weight Bearing Restrictions: No    Mobility  Bed Mobility Overal bed mobility: Needs Assistance Bed Mobility: Rolling;Sidelying to Sit;Sit to Sidelying Rolling: Supervision Sidelying to sit: Mod assist     Sit to sidelying: Min assist General bed mobility comments: VCs for positioning and technique, pain limiting ability to comply with activity precautions. Increased physical assist required today  Transfers Overall transfer level: Needs assistance Equipment used: Rolling walker (2 wheeled) Transfers: Sit to/from Stand Sit to Stand: Min guard         General transfer comment: min guard to elevate to standing. No physical assist. Impulsive due to pain  Ambulation/Gait Ambulation/Gait assistance: Min assist Ambulation Distance (Feet): 120 Feet Assistive device: Rolling walker (2 wheeled) Gait Pattern/deviations: Step-through pattern;Decreased stride length;Trunk flexed Gait  velocity: Decreased Gait velocity interpretation: <1.8 ft/sec, indicate of risk for recurrent falls General Gait Details: VCs for positioning, assist for safety and control of RW. patient veering into wall and poorly able to mobilize with device today.    Stairs             Wheelchair Mobility    Modified Rankin (Stroke Patients Only)       Balance Overall balance assessment: Needs assistance Sitting-balance support: Feet supported;Bilateral upper extremity supported Sitting balance-Leahy Scale: Poor Sitting balance - Comments: Difficulty sitting without UE support due to pain   Standing balance support: Bilateral upper extremity supported;During functional activity;No upper extremity supported Standing balance-Leahy Scale: Poor Standing balance comment: Heavy relaince on UE support                            Cognition Arousal/Alertness: Awake/alert Behavior During Therapy: Flat affect;Anxious;Impulsive Overall Cognitive Status: Impaired/Different from baseline Area of Impairment: Safety/judgement;Awareness;Problem solving;Attention                   Current Attention Level: Sustained     Safety/Judgement: Decreased awareness of safety Awareness: Emergent Problem Solving: Slow processing;Decreased initiation;Requires verbal cues;Requires tactile cues General Comments: Max multi modal cues for safety with activity      Exercises      General Comments        Pertinent Vitals/Pain Pain Assessment: 0-10 Faces Pain Scale: Hurts worst Pain Location: back and leg/ankle Pain Descriptors / Indicators: Radiating;Operative site guarding Pain Intervention(s): Monitored during session;Patient requesting pain meds-RN notified    Home Living                      Prior Function  PT Goals (current goals can now be found in the care plan section) Acute Rehab PT Goals Patient Stated Goal: Decrease pain PT Goal Formulation: With  patient/family Time For Goal Achievement: 07/14/17 Potential to Achieve Goals: Good Progress towards PT goals: Not progressing toward goals - comment(limited by pain)    Frequency    Min 5X/week      PT Plan Current plan remains appropriate    Co-evaluation              AM-PAC PT "6 Clicks" Daily Activity  Outcome Measure  Difficulty turning over in bed (including adjusting bedclothes, sheets and blankets)?: Unable Difficulty moving from lying on back to sitting on the side of the bed? : Unable Difficulty sitting down on and standing up from a chair with arms (e.g., wheelchair, bedside commode, etc,.)?: Unable Help needed moving to and from a bed to chair (including a wheelchair)?: A Little Help needed walking in hospital room?: A Little Help needed climbing 3-5 steps with a railing? : Total 6 Click Score: 10    End of Session Equipment Utilized During Treatment: Gait belt;Back brace Activity Tolerance: Patient limited by pain Patient left: in bed;with call bell/phone within reach;with family/visitor present Nurse Communication: Mobility status PT Visit Diagnosis: Pain;Other symptoms and signs involving the nervous system (R29.898) Pain - part of body: (back)     Time: 1610-9604 PT Time Calculation (min) (ACUTE ONLY): 18 min  Charges:  $Gait Training: 8-22 mins                    G Codes:       Charlotte Crumb, PT DPT  Board Certified Neurologic Specialist [phone]    Fabio Asa 07/08/2017, 9:42 AM

## 2017-07-08 NOTE — Progress Notes (Signed)
Occupational Therapy Treatment Patient Details Name: Robin Collier MRN: [id number] DOB: 11/07/1950 Today's Date: 07/08/2017    History of present illness Pt is a 67 y/o female who presents s/p L5-S1 PLIF on 07/06/17.    OT comments  Pt appears confused.  She follows simple commands with max cuing.  She is very anxious today, appears in pain, but requires significant coaxing to be able to articulate this.  She propels herself with all attempts at moving and requires max cuing to slow down and to move in controlled fashion.  She was only able to move to EOB with mod A, but unable to maintain EOB sitting, before thrusting herself to the Rt and posteriorly stating "I can't, I can't".   Once pt returned to supine and attention improved, she was able to state she has pain.  RN notified.  Based on today's session, anticipate she may require SNF level therapies at discharge.  Will follow.   Follow Up Recommendations  SNF;Supervision/Assistance - 24 hour    Equipment Recommendations  3 in 1 bedside commode    Recommendations for Other Services      Precautions / Restrictions Precautions Precautions: Fall;Back Precaution Booklet Issued: Yes (comment) Precaution Comments: Pt only able to recall 1/3 back precautions.  She required max cues to recall remainder of precautions  Required Braces or Orthoses: Spinal Brace Spinal Brace: Lumbar corset;Applied in sitting position       Mobility Bed Mobility Overal bed mobility: Needs Assistance Bed Mobility: Rolling;Sidelying to Sit;Sit to Sidelying Rolling: Supervision Sidelying to sit: Mod assist     Sit to sidelying: Mod assist General bed mobility comments: Pt moves very quickly and impulsively.  She will throw herself to her Lt when rolling - supervision for safety and control.  She requires assist to lift and lower trunk as well as cues for sequencing   Transfers                 General transfer comment: unable to achieve  standing today     Balance Overall balance assessment: Needs assistance Sitting-balance support: Bilateral upper extremity supported;Feet supported Sitting balance-Leahy Scale: Poor Sitting balance - Comments: Pt with heavy posterior and Rt lean - attempting to propel self Rt and posteriorly.  When asked what she is doing she states "I can't, I can't, I need to lie down"       Standing balance comment: unable to achieve standing today                            ADL either performed or assessed with clinical judgement   ADL Overall ADL's : Needs assistance/impaired                                       General ADL Comments: Pt unable to engage in ADL tasks today      Vision       Perception     Praxis      Cognition Arousal/Alertness: Awake/alert;Lethargic Behavior During Therapy: Anxious;Impulsive;Flat affect Overall Cognitive Status: Impaired/Different from baseline Area of Impairment: Attention;Memory;Following commands;Safety/judgement;Problem solving                   Current Attention Level: Sustained;Focused Memory: Decreased short-term memory;Decreased recall of precautions Following Commands: Follows one step commands inconsistently;Follows one step commands with increased time Safety/Judgement: Decreased awareness of  safety;Decreased awareness of deficits   Problem Solving: Slow processing;Decreased initiation;Difficulty sequencing;Requires verbal cues;Requires tactile cues General Comments: Pt is very impulsive.  She thrashes around in the bed and requires max cues and attempts to redirect pt.  Once she is refocused she will follow one step commands with cues         Exercises     Shoulder Instructions       General Comments RN notified of behaviors     Pertinent Vitals/ Pain       Pain Assessment: 0-10 Faces Pain Scale: Hurts worst Pain Location: back and leg/ankle Pain Descriptors / Indicators:  Restless;Grimacing;Guarding Pain Intervention(s): Monitored during session;Repositioned;Patient requesting pain meds-RN notified  Home Living                                          Prior Functioning/Environment              Frequency  Min 2X/week        Progress Toward Goals  OT Goals(current goals can now be found in the care plan section)  Progress towards OT goals: Not progressing toward goals - comment     Plan Discharge plan needs to be updated    Co-evaluation                 AM-PAC PT "6 Clicks" Daily Activity     Outcome Measure   Help from another person eating meals?: None Help from another person taking care of personal grooming?: Total Help from another person toileting, which includes using toliet, bedpan, or urinal?: Total Help from another person bathing (including washing, rinsing, drying)?: Total Help from another person to put on and taking off regular upper body clothing?: Total Help from another person to put on and taking off regular lower body clothing?: Total 6 Click Score: 9    End of Session    OT Visit Diagnosis: Other abnormalities of gait and mobility (R26.89);Pain;Unsteadiness on feet (R26.81) Pain - part of body: (back )   Activity Tolerance Patient limited by pain   Patient Left in bed;with call bell/phone within reach;with bed alarm set   Nurse Communication Mobility status;Patient requests pain meds        Time: 1521-1540 OT Time Calculation (min): 19 min  Charges: OT General Charges $OT Visit: 1 Visit OT Treatments $Therapeutic Activity: 8-22 mins  [employer], OTR/L [phone]    Jeani Hawking M 07/08/2017, 4:20 PM

## 2017-07-08 NOTE — Progress Notes (Signed)
Patient ID: Robin Collier, female   DOB: [birth year], 67 y.o.   MRN: [id number] Events of evening noted, spoke with her nurse, will check u/a, c/o some headache and back and leg pain, good strength, dressing dry and flat, MAEx4

## 2017-07-09 LAB — URINE CULTURE
Culture: NO GROWTH
SPECIAL REQUESTS: NORMAL

## 2017-07-09 MED ORDER — CIPROFLOXACIN IN D5W 200 MG/100ML IV SOLN
200.0000 mg | Freq: Two times a day (BID) | INTRAVENOUS | Status: DC
  Administered 2017-07-09 – 2017-07-10 (×2): 200 mg via INTRAVENOUS
  Filled 2017-07-09 (×4): qty 100

## 2017-07-09 MED ORDER — SODIUM CHLORIDE 0.9 % IV SOLN
INTRAVENOUS | Status: DC
  Administered 2017-07-09 – 2017-07-11 (×3): via INTRAVENOUS

## 2017-07-09 NOTE — Progress Notes (Addendum)
Subjective: Patient reports some back pain but no leg pain. Is currently working with OT. Still confused at times.   Objective: Vital signs in last 24 hours: Temp:  [98.8 F (37.1 C)-101.1 F (38.4 C)] 99.7 F (37.6 C) (05/19 0825) Pulse Rate:  [61-77] 61 (05/19 0825) Resp:  [18-20] 20 (05/19 0825) BP: (118-136)/(53-73) 129/61 (05/19 0825) SpO2:  [91 %-97 %] 94 % (05/19 0825)  Intake/Output from previous day: 05/18 0701 - 05/19 0700 In: 120 [P.O.:120] Out: 150 [Urine:150] Intake/Output this shift: No intake/output data recorded.  Neurologic: Grossly normal  Lab Results: Lab Results  Component Value Date   WBC 12.3 (H) 07/07/2017   HGB 10.5 (L) 07/07/2017   HCT 32.6 (L) 07/07/2017   MCV 90.3 07/07/2017   PLT 384 07/07/2017   Lab Results  Component Value Date   INR 1.05 07/07/2017   BMET Lab Results  Component Value Date   NA 139 07/07/2017   K 3.6 07/07/2017   CL 100 (L) 07/07/2017   CO2 28 07/07/2017   GLUCOSE 120 (H) 07/07/2017   BUN 7 07/07/2017   CREATININE 1.11 (H) 07/07/2017   CALCIUM 8.9 07/07/2017    Studies/Results: No results found.  Assessment/Plan: Postop day 3 from lumbar surgery. Had some confusion yesterday, UA consistent with UTI. Will start her on abx for this while we wait culture. Continue to mobilize and work with therapy.    LOS: 3 days    Robin Collier [facility] 07/09/2017, 8:38 AM

## 2017-07-09 NOTE — Plan of Care (Signed)
  Problem: Pain Managment: Goal: General experience of comfort will improve Outcome: Progressing   Problem: Clinical Measurements: Goal: Ability to maintain clinical measurements within normal limits will improve Outcome: Progressing   

## 2017-07-09 NOTE — NC FL2 (Signed)
[place] MEDICAID FL2 LEVEL OF CARE SCREENING TOOL     IDENTIFICATION  Patient Name: Robin Collier Birthdate: [birth year] Sex: female Admission Date (Current Location): 07/06/2017  [facility] and [place] Number:  [employer] and Address:  [facility] [place], [address] [place], [place] [address]      Provider Number: [id number]  Attending Physician Name and Address:  Lisbeth Renshaw, MD  Relative Name and Phone Number:  Fransico Michael; [phone]    Current Level of Care: Hospital Recommended Level of Care: Skilled Nursing Facility Prior Approval Number:    Date Approved/Denied:   PASRR Number: [id number]  Discharge Plan: SNF    Current Diagnoses: Patient Active Problem List   Diagnosis Date Noted  . Lumbar radiculopathy 07/06/2017  . Diplopia 02/15/2017  . Common migraine with intractable migraine 02/15/2017  . Gait abnormality 02/15/2017  . Vertigo 02/15/2017  . Tremor, essential 02/15/2017  . ABDOMINAL BLOATING 07/30/2009  . MIGRAINE HEADACHE 07/29/2009  . GLAUCOMA 07/29/2009  . HYPERTENSION 07/29/2009  . UTI 07/29/2009  . VERTIGO 07/29/2009  . ANXIETY 07/23/2009  . GERD 07/23/2009  . DIVERTICULOSIS, COLON 07/23/2009  . HYPERLIPIDEMIA 10/11/2006  . DEPRESSION 10/11/2006  . IBS 10/11/2006  . DIVERTICULITIS, HX OF 10/11/2006    Orientation RESPIRATION BLADDER Height & Weight     Self, Time, Situation, Place  Normal Incontinent, Indwelling catheter Weight: 174 lb 2.6 oz (79 kg) Height:   (162.6 cm)  BEHAVIORAL SYMPTOMS/MOOD NEUROLOGICAL BOWEL NUTRITION STATUS      Continent Diet(see discharge summary)  AMBULATORY STATUS COMMUNICATION OF NEEDS Skin   Extensive Assist Verbally Surgical wounds(incision on back with honeycomb dressing)                       Personal Care Assistance Level of Assistance  Bathing, Feeding, Dressing Bathing Assistance: Maximum assistance Feeding assistance: Independent Dressing  Assistance: Maximum assistance     Functional Limitations Info  Sight, Hearing, Speech Sight Info: Adequate Hearing Info: Adequate Speech Info: Adequate    SPECIAL CARE FACTORS FREQUENCY  PT (By licensed PT), OT (By licensed OT)     PT Frequency: 5x week OT Frequency: 5x week            Contractures Contractures Info: Not present    Additional Factors Info  Code Status, Allergies, Psychotropic Code Status Info: Full Code Allergies Info: SEROQUEL QUETIAPINE FUMARATE, GABAPENTIN, HYDROCODONE  Psychotropic Info: clonazePAM (KLONOPIN) tablet 0.5 mg 2x day PO; sertraline (ZOLOFT) tablet 100 mg daily; traZODone (DESYREL) tablet 300 mg daily at bedtime         Current Medications (07/09/2017):  This is the current hospital active medication list Current Facility-Administered Medications  Medication Dose Route Frequency Provider Last Rate Last Dose  . 0.9 %  sodium chloride infusion   Intravenous Continuous Meyran, Tiana Loft, NP 50 mL/hr at 07/09/17 0910    . acetaminophen (TYLENOL) tablet 650 mg  650 mg Oral Q4H PRN Alyson Ingles, PA-C   650 mg at 07/09/17 0351   Or  . acetaminophen (TYLENOL) suppository 650 mg  650 mg Rectal Q4H PRN Costella, Darci Current, PA-C      . bisacodyl (DULCOLAX) suppository 10 mg  10 mg Rectal Daily PRN Costella, Darci Current, PA-C      . ciprofloxacin (CIPRO) IVPB 200 mg  200 mg Intravenous Q12H Meyran, Tiana Loft, NP   Stopped at 07/09/17 1131  . clonazePAM (KLONOPIN) tablet 0.5 mg  0.5 mg  Oral BID Lisbeth Renshaw, MD   0.5 mg at 07/09/17 0910  . docusate sodium (COLACE) capsule 100 mg  100 mg Oral BID Alyson Ingles, PA-C   100 mg at 07/09/17 0910  . HYDROcodone-acetaminophen (NORCO/VICODIN) 5-325 MG per tablet 1 tablet  1 tablet Oral Q4H PRN Alyson Ingles, PA-C   1 tablet at 07/09/17 0910  . HYDROmorphone (DILAUDID) injection 0.5-1 mg  0.5-1 mg Intravenous Q2H PRN Costella, Darci Current, PA-C      . lactated ringers infusion    Intravenous Continuous Heather Roberts, MD   Stopped at 07/06/17 1715  . lamoTRIgine (LAMICTAL) tablet 200 mg  200 mg Oral BID Lisbeth Renshaw, MD   200 mg at 07/09/17 0910  . menthol-cetylpyridinium (CEPACOL) lozenge 3 mg  1 lozenge Oral PRN Costella, Darci Current, PA-C       Or  . phenol (CHLORASEPTIC) mouth spray 1 spray  1 spray Mouth/Throat PRN Costella, Darci Current, PA-C      . methocarbamol (ROBAXIN) tablet 500 mg  500 mg Oral Q6H PRN Costella, Darci Current, PA-C   500 mg at 07/09/17 0981   Or  . methocarbamol (ROBAXIN) 500 mg in dextrose 5 % 50 mL IVPB  500 mg Intravenous Q6H PRN Costella, Vincent J, PA-C      . ondansetron (ZOFRAN) tablet 4 mg  4 mg Oral Q6H PRN Costella, Vincent J, PA-C       Or  . ondansetron (ZOFRAN) injection 4 mg  4 mg Intravenous Q6H PRN Costella, Darci Current, PA-C      . oxyCODONE (Oxy IR/ROXICODONE) immediate release tablet 5-10 mg  5-10 mg Oral Q3H PRN Costella, Darci Current, PA-C   5 mg at 07/07/17 2115  . pantoprazole (PROTONIX) EC tablet 40 mg  40 mg Oral Daily Lisbeth Renshaw, MD   40 mg at 07/09/17 0910  . propranolol ER (INDERAL LA) 24 hr capsule 60 mg  60 mg Oral Daily Lisbeth Renshaw, MD   60 mg at 07/09/17 1202  . pyridostigmine (MESTINON) tablet 30 mg  30 mg Oral TID Lisbeth Renshaw, MD   30 mg at 07/09/17 1202  . senna (SENOKOT) tablet 8.6 mg  1 tablet Oral BID Costella, Darci Current, PA-C   8.6 mg at 07/09/17 0910  . senna-docusate (Senokot-S) tablet 1 tablet  1 tablet Oral QHS PRN Costella, Darci Current, PA-C      . sertraline (ZOLOFT) tablet 100 mg  100 mg Oral Daily Lisbeth Renshaw, MD   100 mg at 07/09/17 0910  . sodium phosphate (FLEET) 7-19 GM/118ML enema 1 enema  1 enema Rectal Once PRN Costella, Darci Current, PA-C      . tamsulosin (FLOMAX) capsule 0.4 mg  0.4 mg Oral Daily Lisbeth Renshaw, MD   0.4 mg at 07/09/17 0910  . traZODone (DESYREL) tablet 300 mg  300 mg Oral QHS Lisbeth Renshaw, MD   300 mg at 07/08/17 2122     Discharge  Medications: Please see discharge summary for a list of discharge medications.  Relevant Imaging Results:  Relevant Lab Results:   Additional Information SS# [id number] [place], [place]

## 2017-07-09 NOTE — Progress Notes (Signed)
Occupational Therapy Treatment Patient Details Name: JAMYIAH LABELLA MRN: [id number] DOB: 1950-06-03 Today's Date: 07/09/2017    History of present illness Pt is a 67 y/o female who presents s/p L5-S1 PLIF on 07/06/17.    OT comments  Pt. Able to roll x2 and almost achieve fully seated eob.  During bed mobility this session.  Tx. Remains limited secondary to increased c/o pain.  Will continue current POC with focus of increasing mobility to begin ADL goals as pt. Able to tolerate.    Follow Up Recommendations  SNF;Supervision/Assistance - 24 hour    Equipment Recommendations  3 in 1 bedside commode    Recommendations for Other Services      Precautions / Restrictions Precautions Precautions: Fall;Back Required Braces or Orthoses: Spinal Brace Spinal Brace: Lumbar corset;Applied in sitting position       Mobility Bed Mobility Overal bed mobility: Needs Assistance Bed Mobility: Rolling;Sidelying to Sit;Sit to Sidelying Rolling: Supervision Sidelying to sit: Mod assist     Sit to sidelying: Mod assist;+2 for physical assistance General bed mobility comments: pt. able to bend knees and initiate rolling to R with reliance on bed rail, took 2 attempts to stay in R sidelying.  able to bring bles off of bed without physical assistance max a in attempts to guide trunk upright, nursing present to assist. pt. yelling that she has to lie back down.  max physical and encouragment to sit upright eob. pt. stating she is unable.  required 2 person assist for back to bed and repositioning.  Transfers                      Balance                                           ADL either performed or assessed with clinical judgement   ADL                                               Vision       Perception     Praxis      Cognition Arousal/Alertness: Lethargic;Awake/alert Behavior During Therapy: Anxious;Impulsive;Flat affect                                             Exercises     Shoulder Instructions       General Comments      Pertinent Vitals/ Pain       Pain Assessment: 0-10 Pain Score: 10-Worst pain ever Pain Location: back Pain Descriptors / Indicators: Restless;Grimacing;Guarding  Home Living                                          Prior Functioning/Environment              Frequency  Min 2X/week        Progress Toward Goals  OT Goals(current goals can now be found in the care plan section)  Progress towards OT goals: Not progressing toward goals - comment(pain remains  limiting factor)     Plan Discharge plan needs to be updated    Co-evaluation                 AM-PAC PT "6 Clicks" Daily Activity     Outcome Measure   Help from another person eating meals?: None Help from another person taking care of personal grooming?: Total Help from another person toileting, which includes using toliet, bedpan, or urinal?: Total Help from another person bathing (including washing, rinsing, drying)?: Total Help from another person to put on and taking off regular upper body clothing?: Total Help from another person to put on and taking off regular lower body clothing?: Total 6 Click Score: 9    End of Session    OT Visit Diagnosis: Other abnormalities of gait and mobility (R26.89);Pain;Unsteadiness on feet (R26.81)   Activity Tolerance Patient limited by pain   Patient Left in bed;with call bell/phone within reach;with nursing/sitter in room   Nurse Communication          Time: 2841-3244 OT Time Calculation (min): 16 min  Charges: OT General Charges $OT Visit: 1 Visit OT Treatments $Self Care/Home Management : 8-22 mins  Robet Leu, COTA/L 07/09/2017, 9:24 AM

## 2017-07-10 LAB — CBC WITH DIFFERENTIAL/PLATELET
Abs Immature Granulocytes: 0 10*3/uL (ref 0.0–0.1)
Basophils Absolute: 0 10*3/uL (ref 0.0–0.1)
Basophils Relative: 0 %
Eosinophils Absolute: 0.1 10*3/uL (ref 0.0–0.7)
Eosinophils Relative: 1 %
HCT: 28.8 % — ABNORMAL LOW (ref 36.0–46.0)
Hemoglobin: 9.5 g/dL — ABNORMAL LOW (ref 12.0–15.0)
Immature Granulocytes: 1 %
Lymphocytes Relative: 20 %
Lymphs Abs: 1.6 10*3/uL (ref 0.7–4.0)
MCH: 29.5 pg (ref 26.0–34.0)
MCHC: 33 g/dL (ref 30.0–36.0)
MCV: 89.4 fL (ref 78.0–100.0)
Monocytes Absolute: 0.9 10*3/uL (ref 0.1–1.0)
Monocytes Relative: 10 %
Neutro Abs: 5.6 10*3/uL (ref 1.7–7.7)
Neutrophils Relative %: 68 %
Platelets: 325 10*3/uL (ref 150–400)
RBC: 3.22 MIL/uL — ABNORMAL LOW (ref 3.87–5.11)
RDW: 13.8 % (ref 11.5–15.5)
WBC: 8.3 10*3/uL (ref 4.0–10.5)

## 2017-07-10 LAB — BASIC METABOLIC PANEL
Anion gap: 11 (ref 5–15)
BUN: 8 mg/dL (ref 6–20)
CO2: 28 mmol/L (ref 22–32)
Calcium: 8.3 mg/dL — ABNORMAL LOW (ref 8.9–10.3)
Chloride: 98 mmol/L — ABNORMAL LOW (ref 101–111)
Creatinine, Ser: 0.92 mg/dL (ref 0.44–1.00)
GFR calc Af Amer: >60 mL/min (ref >60)
GFR calc non Af Amer: >60 mL/min (ref >60)
Glucose, Bld: 102 mg/dL — ABNORMAL HIGH (ref 65–99)
Potassium: 2.7 mmol/L — CL (ref 3.5–5.1)
Sodium: 137 mmol/L (ref 135–145)

## 2017-07-10 MED ORDER — POTASSIUM CHLORIDE 10 MEQ/100ML IV SOLN
10.0000 meq | INTRAVENOUS | Status: AC
  Administered 2017-07-10 (×6): 10 meq via INTRAVENOUS
  Filled 2017-07-10 (×6): qty 100

## 2017-07-10 NOTE — Care Management Important Message (Signed)
Important Message  Patient Details  Name: Robin Collier MRN: [id number] Date of Birth: 1950/08/05   Medicare Important Message Given:  Yes    Dorena Bodo 07/10/2017, 3:42 PM

## 2017-07-10 NOTE — Progress Notes (Signed)
Physical Therapy Treatment Patient Details Name: Robin Collier MRN: [id number] DOB: 1950-08-20 Today's Date: 07/10/2017    History of Present Illness Pt is a 67 y/o female who presents s/p L5-S1 PLIF on 07/06/17.     PT Comments    Patient limited by c/o pain and demonstrated decreased safety awareness throughout session. Pt required max cues for safety and maintaining back precautions with all mobility. Pt required mod A +2 for stand pivot transfer EOB <>BCS and ultimately required use of bed pan due to inability to remain sitting EOB or BSC. Recommend SNF for further skilled PT services to maximize independence and safety with mobility.   Follow Up Recommendations  SNF;Supervision for mobility/OOB     Equipment Recommendations  Rolling walker with 5" wheels;3in1 (PT)    Recommendations for Other Services       Precautions / Restrictions Precautions Precautions: Fall;Back Precaution Comments: Pt able to recall 2/3 back precautions this session  Required Braces or Orthoses: Spinal Brace Spinal Brace: Lumbar corset;Applied in sitting position Restrictions Weight Bearing Restrictions: No    Mobility  Bed Mobility Overal bed mobility: Needs Assistance Bed Mobility: Rolling;Sidelying to Sit;Sit to Sidelying Rolling: Supervision Sidelying to sit: Min guard     Sit to sidelying: Mod assist;+2 for physical assistance General bed mobility comments: pt is able to roll sid to side without physical assist with use of rails; pt is able to almost come completely into sitting from sidelying however cannot achieve sitting midline due to c/o pain in L buttock and allows herself to fall down onto bed multiple times however pt declines assistance; cues for safety and +2 to go sidelying to supine and to scoot up the bed as well as get onto bed pan  Transfers Overall transfer level: Needs assistance Equipment used: 2 person hand held assist Transfers: Sit to/from [employer] Sit to Stand: Mod assist;+2 physical assistance;+2 safety/equipment Stand pivot transfers: Mod assist;+2 physical assistance;+2 safety/equipment       General transfer comment: +2 assistance required for balance and safety as pt impulsively stood from EOB going toward [facility]; pt with lack of safety awareness; once pt was at [facility] she stood multiple times attempting to sit and void however pt unable to maintain sitting and returned to bed for use of bed pan  Ambulation/Gait                 Stairs             Wheelchair Mobility    Modified Rankin (Stroke Patients Only)       Balance Overall balance assessment: Needs assistance Sitting-balance support: Bilateral upper extremity supported;Feet supported Sitting balance-Leahy Scale: Poor     Standing balance support: Bilateral upper extremity supported;During functional activity;No upper extremity supported Standing balance-Leahy Scale: Poor                              Cognition Arousal/Alertness: Awake/alert Behavior During Therapy: Anxious;Impulsive;Flat affect Overall Cognitive Status: Impaired/Different from baseline Area of Impairment: Attention;Memory;Following commands;Safety/judgement;Problem solving                   Current Attention Level: Sustained;Focused Memory: Decreased short-term memory;Decreased recall of precautions Following Commands: Follows one step commands inconsistently;Follows one step commands with increased time Safety/Judgement: Decreased awareness of safety;Decreased awareness of deficits Awareness: Emergent Problem Solving: Slow processing;Decreased initiation;Difficulty sequencing;Requires verbal cues;Requires tactile cues General Comments: Pt continues to remain very impulsive and  thrashes around in the bed when attempting mobility (due to pain), difficult to redirect during this time. Pt requires Max cues for safety and to follow commands      Exercises       General Comments        Pertinent Vitals/Pain Pain Assessment: Faces Faces Pain Scale: Hurts worst Pain Location: L buttocks region Pain Descriptors / Indicators: Restless;Grimacing;Guarding Pain Intervention(s): Limited activity within patient's tolerance;Monitored during session;Repositioned;Patient requesting pain meds-RN notified    Home Living                      Prior Function            PT Goals (current goals can now be found in the care plan section) Acute Rehab PT Goals Patient Stated Goal: Decrease pain Progress towards PT goals: Not progressing toward goals - comment(pain limiting factor)    Frequency    Min 5X/week      PT Plan Discharge plan needs to be updated    Co-evaluation PT/OT/SLP Co-Evaluation/Treatment: Yes Reason for Co-Treatment: Necessary to address cognition/behavior during functional activity;For patient/therapist safety;To address functional/ADL transfers PT goals addressed during session: Mobility/safety with mobility OT goals addressed during session: ADL's and self-care;Proper use of Adaptive equipment and DME      AM-PAC PT "6 Clicks" Daily Activity  Outcome Measure  Difficulty turning over in bed (including adjusting bedclothes, sheets and blankets)?: Unable Difficulty moving from lying on back to sitting on the side of the bed? : Unable Difficulty sitting down on and standing up from a chair with arms (e.g., wheelchair, bedside commode, etc,.)?: Unable Help needed moving to and from a bed to chair (including a wheelchair)?: A Lot Help needed walking in hospital room?: Total Help needed climbing 3-5 steps with a railing? : Total 6 Click Score: 7    End of Session   Activity Tolerance: Patient limited by pain Patient left: in bed;with call bell/phone within reach Nurse Communication: Mobility status PT Visit Diagnosis: Pain;Other symptoms and signs involving the nervous system (R29.898) Pain - part of body:  (back)     Time: 4098-1191 PT Time Calculation (min) (ACUTE ONLY): 40 min  Charges:  $Therapeutic Activity: 23-37 mins                    G Codes:       Erline Levine, PTA Pager: [phone]     Carolynne Edouard 07/10/2017, 3:42 PM

## 2017-07-10 NOTE — Care Management Note (Signed)
Case Management Note  Patient Details  Name: Robin Collier MRN: [id number] Date of Birth: 1950/11/15  Subjective/Objective:                    Action/Plan: Pt has decided she wants to try to go to [facility] for rehab. CSW updated. CM following.  Expected Discharge Date:                  Expected Discharge Plan:  Home w Home Health Services  In-House Referral:     Discharge planning Services  CM Consult  Post Acute Care Choice:  Home Health, Durable Medical Equipment Choice offered to:  Patient  DME Arranged:  3-N-1, Walker rolling DME Agency:  [facility]  HH Arranged:  PT, OT [facility] Agency:  [facility]  Status of Service:  In process, will continue to follow  If discussed at Long Length of Stay Meetings, dates discussed:    Additional Comments:  Kermit Balo, RN 07/10/2017, 3:33 PM

## 2017-07-10 NOTE — Progress Notes (Signed)
CSW alerted by [facility] that patient has been talked into SNF by her daughter, and would like [facility] in [place]. [facility] in [place] has offered a bed for the patient.  CSW to continue to follow.  Blenda Nicely, [place] Clinical Social Worker [phone]

## 2017-07-10 NOTE — Progress Notes (Signed)
Addendum Received call regarding abnormal labs. Will need to stay at least one more night to ensure normalized.  Hypokalemia - Potassium 2.7. Will give IV runs/po. - Repeat BMP tomorrow am  Mild Anemia - Hgb 9.5, Hct 28.8 - Likely combination of dilutional and acute blood loss.  - Will repeat in the am

## 2017-07-10 NOTE — Progress Notes (Signed)
Lab called with a critical potassium of 2.7 MD notified  Nurse will continue to monitor.

## 2017-07-10 NOTE — Progress Notes (Addendum)
Occupational Therapy Treatment Patient Details Name: Robin Collier MRN: [id number] DOB: Jun 27, 1950 Today's Date: 07/10/2017    History of present illness Pt is a 67 y/o female who presents s/p L5-S1 PLIF on 07/06/17.    OT comments  Pt making very slow progress towards OT goals at this time, mostly limited by pain during session. Pt demonstrates decreased safety awareness with all aspects of mobility, requiring increased assist and max cues to follow simple commands and to adhere to back precautions. Pt requires multiple attempts to complete bed mobility and ultimately requiring ModA+2 for stand pivot transfer to/from [facility] due to decreased safety awareness and impulsivity. Pt incontinent of BM at start of session and requiring totalA for peri-care at bed level. Continue to recommend pt receive SNF level services prior to discharge as pt remains a high fall risk at this time and requiring max-total assist for ADL completion. Per chart review pt actively refusing SNF, if pt is to return home she will require hands on 24hr supervision and recommend followup HHOT services. Will continue to follow acutely to progress pt towards established OT goals and to assist with facilitating safe d/c plan.    Follow Up Recommendations  SNF;Supervision/Assistance - 24 hour    Equipment Recommendations  3 in 1 bedside commode          Precautions / Restrictions Precautions Precautions: Fall;Back Precaution Comments: Pt able to recall 2/3 back precautions this sesssion  Required Braces or Orthoses: Spinal Brace Spinal Brace: Lumbar corset;Applied in sitting position Restrictions Weight Bearing Restrictions: No       Mobility Bed Mobility Overal bed mobility: Needs Assistance Bed Mobility: Rolling;Sidelying to Sit;Sit to Sidelying Rolling: Supervision Sidelying to sit: Min guard     Sit to sidelying: Mod assist;+2 for physical assistance General bed mobility comments: Pt rolls to L/R without  difficulty; is easily able to initiate transferring from sidelying to sitting EOB though once almost completely upright in sitting pt allows herself to fall back down onto bed, stating due to pain and resistant to having assist to sit upright. Pt completing this pattern multiple times before achieving sitting EOB, +2 for safety utilized at this time, +2 assist required to boot up to [facility] end of session   Transfers Overall transfer level: Needs assistance Equipment used: 2 person hand held assist Transfers: Sit to/from [employer] Sit to Stand: Mod assist;+2 physical assistance;+2 safety/equipment Stand pivot transfers: Mod assist;+2 physical assistance;+2 safety/equipment       General transfer comment: pt impulsively standing once sitting EOB (due to pain in sitting?), pt demonstrates decreased safety and impulsivity with all movements requiring modA+2 to stand and pivot EOB<>BSC; once seated on BSC pt completing multiple sit<>stands as she experiences increased pain when sitting, ultimately transferred back to EOB for use of bed pan as pt could not tolerate sitting; pt requires max safety cues during transfer as she impulsively sits EOB and initiates transferring herself back to sidelying/supine in bed    Balance Overall balance assessment: Needs assistance Sitting-balance support: Bilateral upper extremity supported;Feet supported Sitting balance-Leahy Scale: Poor     Standing balance support: Bilateral upper extremity supported;During functional activity;No upper extremity supported Standing balance-Leahy Scale: Poor                             ADL either performed or assessed with clinical judgement   ADL Overall ADL's : Needs assistance/impaired Eating/Feeding: Set up;Bed level  Upper Body Dressing : Minimal assistance;Sitting       Toilet Transfer: Moderate assistance;+2 for physical assistance;+2 for  safety/equipment;Stand-pivot;BSC   Toileting- Clothing Manipulation and Hygiene: Total assistance;Bed level       Functional mobility during ADLs: Moderate assistance;+2 for physical assistance;+2 for safety/equipment General ADL Comments: Pt with incontinent BM at start of session requiring total assist for peri-care at bed level. Pt also with increased pain/discomfort in L buttocks region this session impacting her ability to participate during session. Pt with decreased safety awareness and impulsively moving during session due to pain. Pt attempting bed mobility on either side of bed though was not able to achieve sitting EOB due to pain, with increased time pt able to complete stand pivot to [facility], required ModA+2 for assist and for safety. Pt not able to tolerate sitting on BSC therefore returned to supine in bed and positioned on bed pan.                        Cognition Arousal/Alertness: Awake/alert Behavior During Therapy: Anxious;Impulsive;Flat affect Overall Cognitive Status: Impaired/Different from baseline Area of Impairment: Attention;Memory;Following commands;Safety/judgement;Problem solving                   Current Attention Level: Sustained;Focused Memory: Decreased short-term memory;Decreased recall of precautions Following Commands: Follows one step commands inconsistently;Follows one step commands with increased time Safety/Judgement: Decreased awareness of safety;Decreased awareness of deficits Awareness: Emergent Problem Solving: Slow processing;Decreased initiation;Difficulty sequencing;Requires verbal cues;Requires tactile cues General Comments: Pt continues to remain very impulsive and thrashes around in the bed when attempting mobility (due to pain), difficult to redirect during this time. Pt requires Max cues for safety and to follow commands                          Pertinent Vitals/ Pain       Pain Assessment: Faces Faces Pain Scale:  Hurts worst Pain Location: L buttocks region Pain Descriptors / Indicators: Restless;Grimacing;Guarding Pain Intervention(s): Monitored during session;Repositioned;RN gave pain meds during session                                                          Frequency  Min 2X/week        Progress Toward Goals  OT Goals(current goals can now be found in the care plan section)  Progress towards OT goals: Not progressing toward goals - comment(pain remains limiting factor )  Acute Rehab OT Goals Patient Stated Goal: Decrease pain OT Goal Formulation: With patient Time For Goal Achievement: 07/21/17 Potential to Achieve Goals: Fair  Plan Discharge plan remains appropriate    Co-evaluation    PT/OT/SLP Co-Evaluation/Treatment: Yes Reason for Co-Treatment: For patient/therapist safety;Necessary to address cognition/behavior during functional activity   OT goals addressed during session: ADL's and self-care;Proper use of Adaptive equipment and DME      AM-PAC PT "6 Clicks" Daily Activity     Outcome Measure   Help from another person eating meals?: None Help from another person taking care of personal grooming?: A Lot Help from another person toileting, which includes using toliet, bedpan, or urinal?: Total Help from another person bathing (including washing, rinsing, drying)?: A Lot Help from another person to put on and taking off regular  upper body clothing?: A Lot Help from another person to put on and taking off regular lower body clothing?: Total 6 Click Score: 12    End of Session    OT Visit Diagnosis: Other abnormalities of gait and mobility (R26.89);Pain;Unsteadiness on feet (R26.81) Pain - part of body: (back )   Activity Tolerance Patient limited by pain   Patient Left in bed;with call bell/phone within reach;with bed alarm set   Nurse Communication Other (comment);Mobility status(pt on bed pan )        Time: 1610-9604 OT Time  Calculation (min): 33 min  Charges: OT General Charges $OT Visit: 1 Visit OT Treatments $Self Care/Home Management : 8-22 mins  Marcy Siren, OT Pager [phone] 07/10/2017    Orlando Penner 07/10/2017, 1:11 PM

## 2017-07-10 NOTE — Progress Notes (Signed)
CSW following for discharge plan. Per MD note, patient is adamantly refusing SNF placement and will discharge home with home health services. CSW not needed for discharge plan.  CSW signing off.  Blenda Nicely, [place] Clinical Social Worker [phone]

## 2017-07-10 NOTE — Progress Notes (Signed)
Neurosurgery Progress Note  Events of the weekend noted. Today, reports continued pain in back with radiation down left leg. Worse with movement. Essentially no improvement in pre-op pain.  Denies any positional headache although has not mobilized much. Tolerating po. Catheter still in place.  EXAM:  BP (!) 125/55 (BP Location: Left Arm)   Pulse (!) 56   Temp 100 F (37.8 C) (Oral)   Resp 18   Ht  (1.626 m)   Wt 79 kg (174 lb 2.6 oz)   SpO2 94%   BMI 29.90 kg/m   Awake, alert, oriented  Speech fluent, appropriate  MAEW with good strength Incision c/d/i, flat. No signs of CSF drainage  IMPRESSION/PLAN 67 y.o. female POD#4 s/p L5-S1 PLIF complicated by intraoperative durotomy. No evidence of CSF drainage on exam. No HA today.   Post op - Essentially no resolution in pre op pain. This was discussed as a possibility with her prior to surgery as she does need likely multi level fusion. Hopefully with time, some of her pain will improve. - Continue to work on mobilizing - PT/OT rec SNF. Declines. Counseled at length regarding benefits. States she has enough support at home. Would like to go [facility] with [facility]. Refuses SNF. - Pain management  Intra-op durotomy - No evidence of CSF drainage - Monitor for positional headaches.   Confusion - Noted to be confused at times over the weekend. Appears improved today. - Urine culture negative for infection. d/c antibiotics. - Will repeat CBC and BMP today.  Urinary retention - Catheter in place - Will remove today  If patient urinates today & blood work is stable, she can be d/c with HH. Again, she was concealed on the risks of refusing SNF and is adamant about home d/c.

## 2017-07-11 LAB — CBC
HCT: 29.6 % — ABNORMAL LOW (ref 36.0–46.0)
Hemoglobin: 9.5 g/dL — ABNORMAL LOW (ref 12.0–15.0)
MCH: 28.9 pg (ref 26.0–34.0)
MCHC: 32.1 g/dL (ref 30.0–36.0)
MCV: 90 fL (ref 78.0–100.0)
Platelets: 382 10*3/uL (ref 150–400)
RBC: 3.29 MIL/uL — ABNORMAL LOW (ref 3.87–5.11)
RDW: 13.7 % (ref 11.5–15.5)
WBC: 7.4 10*3/uL (ref 4.0–10.5)

## 2017-07-11 LAB — BASIC METABOLIC PANEL
Anion gap: 8 (ref 5–15)
BUN: 6 mg/dL (ref 6–20)
CO2: 30 mmol/L (ref 22–32)
Calcium: 8.7 mg/dL — ABNORMAL LOW (ref 8.9–10.3)
Chloride: 103 mmol/L (ref 101–111)
Creatinine, Ser: 0.76 mg/dL (ref 0.44–1.00)
GFR calc Af Amer: >60 mL/min (ref >60)
GFR calc non Af Amer: >60 mL/min (ref >60)
Glucose, Bld: 103 mg/dL — ABNORMAL HIGH (ref 65–99)
Potassium: 3.4 mmol/L — ABNORMAL LOW (ref 3.5–5.1)
Sodium: 141 mmol/L (ref 135–145)

## 2017-07-11 MED ORDER — OXYCODONE-ACETAMINOPHEN 7.5-325 MG PO TABS: 1.0000 | ORAL_TABLET | ORAL | 0 refills | Status: DC | PRN

## 2017-07-11 MED ORDER — POTASSIUM CHLORIDE ER 10 MEQ PO TBCR: 10.0000 meq | EXTENDED_RELEASE_TABLET | Freq: Every day | ORAL | 0 refills | Status: DC

## 2017-07-11 MED ORDER — METHOCARBAMOL 500 MG PO TABS: 500.0000 mg | ORAL_TABLET | Freq: Four times a day (QID) | ORAL | 2 refills | Status: DC | PRN

## 2017-07-11 NOTE — Progress Notes (Signed)
Gave report to nurse heather at [facility]

## 2017-07-11 NOTE — Clinical Social Work Placement (Signed)
Nurse to call report to [phone], [phone]; Room 705     CLINICAL SOCIAL WORK PLACEMENT  NOTE  Date:  07/11/2017  Patient Details  Name: Robin Collier MRN: [id number] Date of Birth: 04-19-1950  Clinical Social Work is seeking post-discharge placement for this patient at the Skilled  Nursing Facility level of care (*CSW will initial, date and re-position this form in  chart as items are completed):  Yes   Patient/family provided with [place] Clinical Social Work Department's list of facilities offering this level of care within the geographic area requested by the patient (or if unable, by the patient's family).  Yes   Patient/family informed of their freedom to choose among providers that offer the needed level of care, that participate in Medicare, Medicaid or managed care program needed by the patient, have an available bed and are willing to accept the patient.  Yes   Patient/family informed of [facility]'s ownership interest in [facility] and [facility], as well as of the fact that they are under no obligation to receive care at these facilities.  PASRR submitted to EDS on       PASRR number received on       Existing PASRR number confirmed on 07/09/17     FL2 transmitted to all facilities in geographic area requested by pt/family on 07/09/17     FL2 transmitted to all facilities within larger geographic area on       Patient informed that his/her managed care company has contracts with or will negotiate with certain facilities, including the following:        Yes   Patient/family informed of bed offers received.  Patient chooses bed at [facility]     Physician recommends and patient chooses bed at      Patient to be transferred to [facility], [place] on 07/11/17.  Patient to be transferred to facility by [facility]     Patient family notified on 07/11/17 of transfer.  Name of family member notified:  Kim-daughter     PHYSICIAN       Additional  Comment:    _______________________________________________ Baldemar Lenis, LCSW 07/11/2017, 1:32 PM

## 2017-07-11 NOTE — Care Management Note (Signed)
Case Management Note  Patient Details  Name: KATHE WIRICK MRN: [id number] Date of Birth: Dec 23, 1950  Subjective/Objective:                    Action/Plan: Pt discharging to [facility] [place] today. CM called [facility] and updated them that [facility] would not be needed at this time. [facility] will arrange DME for patient while she is with them and at d/c from them.  CM signing off.   Expected Discharge Date:  07/11/17               Expected Discharge Plan:  Home w Home Health Services  In-House Referral:  Clinical Social Work  Discharge planning Services  CM Consult  Post Acute Care Choice:    Choice offered to:  Patient  DME Arranged:    DME Agency:     HH Arranged:    HH Agency:     Status of Service:  Completed, signed off  If discussed at [employer], dates discussed:    Additional Comments:  Kermit Balo, RN 07/11/2017, 10:34 AM

## 2017-07-11 NOTE — Progress Notes (Signed)
Neurosurgery Progress Note  No issues overnight.  Continues to endorse LLE pain and left hip weakness.  No positional headaches Has not urinated without I/O cath Patient does not feel ready to leave today  EXAM:  BP (!) 139/54 (BP Location: Left Arm)   Pulse (!) 55   Temp 99.1 F (37.3 C) (Oral)   Resp 20   Ht  (1.626 m)   Wt 79 kg (174 lb 2.6 oz)   SpO2 90%   BMI 29.90 kg/m   Awake, alert, oriented  Speech fluent, appropriate  MAEW with good strength No focal deficits Incision flat, no drainge  IMPRESSION/PLAN 67 y.o. female POD#5 s/p L5-S1 PLIF complicated by intraoperative durotomy. No evidence of CSF drainage on exam.  Post op - Stable - Patients daughter has convinced patient to go to SNF.   Intra-op durotomy - No evidence of CSF drainage - Monitor for positional headaches.   Confusion - Resolved  Urinary retention - Unable to urinate without I/O cath. Will place foley - Outpt urology f/u  Anemia - Mild. Stable. - Repeat CBC outpt in 1-2 weeks  Hypokalemia - Much improved with IV yesterday - will send continue po for several days - Will need repeat BMP in 1-2 weeks post discharge for monitoring  Patient is cleared for discharge although she does not understand why she is being discharged. I have communicated with her at length that all of her pain may not resolve and rehab is best for her to make a recovery as laying around will just cause deconditioning. She states understanding but is upset.

## 2017-07-11 NOTE — Progress Notes (Signed)
Paged md nundkumar at [phone] to clarify order, left message

## 2017-07-11 NOTE — Discharge Summary (Signed)
Physician Discharge Summary  Patient ID: Robin Collier MRN: [id number] DOB/AGE: Aug 11, 1950 67 y.o.  Admit date: 07/06/2017 Discharge date: 07/11/2017  Admission Diagnoses:    Lumbar radiculopathy  Discharge Diagnoses:  Same Active Problems:   Lumbar radiculopathy  Discharged Condition: Stable  Hospital Course:  Robin Collier is a 67 y.o. female who was admitted for the below procedure.   Intra-op complicated by durotomy. Wound without evidence of CSF fistula. No positional headaches. Will continue to monitor.  Post op complicated by hypokalemia, urinary retention, mild anemia.     - Hypokalemia: Given IV potassium which greatly improved K (from 2.7 to 3.4.). Will supplement with po potassium for three days. Repeat BMP in 1-2 weeks.     - Urinary retention: multifactorial. Will need to have cath remain in place. F/U with urology outpt.      - Acute blood loss anemia in combination with dilutional anemia: Stable at time of d/c. Rec repeat CBC in 1-2 weeks for monitoring.    - Confusion: resolved  Despite surgery, continues to have pre operative LLE pain and weakness in left hip. This was explained as a possibility due to the need for multilevel fusion which patient declined.  No focal deficits post op. Cleared for d/c. Will continue to monitor. Hopefully d/c to SNF will help her progress.  Treatments: Surgery 1. L5 laminectomy with facetectomy for decompression of exiting nerve roots, more than would be required for placement of interbody graft 2. Placement of anterior interbody device - [employer]tronic 11mm TLIF 10deg lordotic cage 3. Posterior non-segmental instrumentation using cortical pedicle screws at L5 - S1 4. Interbody arthrodesis, L5-S1 5. Use of locally harvested bone autograft 6. Use of non-structural bone allograft - BMP-2  Discharge Exam: Blood pressure (!) 139/54, pulse (!) 55, temperature 99.1 F (37.3 C), temperature source Oral, resp. rate 20, height 5'  4" (1.626 m), weight 79 kg (174 lb 2.6 oz), SpO2 90 %. Awake, alert, oriented Speech fluent, appropriate CN grossly intact MAEW with good strength Wound c/d/i, flat  Disposition: Discharge disposition: 03-Skilled Nursing Facility       Discharge Instructions    Call MD for:  difficulty breathing, headache or visual disturbances   Complete by:  As directed    Call MD for:  persistant dizziness or light-headedness   Complete by:  As directed    Call MD for:  redness, tenderness, or signs of infection (pain, swelling, redness, odor or green/yellow discharge around incision site)   Complete by:  As directed    Call MD for:  severe uncontrolled pain   Complete by:  As directed    Call MD for:  temperature >100.4   Complete by:  As directed    Diet general   Complete by:  As directed    Driving Restrictions   Complete by:  As directed    Do not drive until given clearance.   Increase activity slowly   Complete by:  As directed    Lifting restrictions   Complete by:  As directed    Do not lift anything >10lbs. Avoid bending and twisting in awkward positions. Avoid bending at the back.   May shower / Bathe   Complete by:  As directed    In 24 hours. Okay to wash wound with warm soapy water. Avoid scrubbing the wound. Pat dry.   Remove dressing in 24 hours   Complete by:  As directed      Allergies as of 07/11/2017  Reactions   Seroquel [quetiapine Fumarate] Nausea Only, Other (See Comments)   SEIZURE-LIKE RESPONSES VERTIGO   Gabapentin Nausea And Vomiting, Other (See Comments)   Lightheadness   Hydrocodone Other (See Comments)   Dizziness      Medication List    TAKE these medications   acetaminophen 500 MG tablet Commonly known as:  TYLENOL Take 1,000-1,500 mg by mouth 3 (three) times daily as needed for moderate pain.   clonazePAM 0.5 MG tablet Commonly known as:  KLONOPIN Take 0.5 mg by mouth 2 (two) times daily.   cyanocobalamin 1000 MCG/ML  injection Commonly known as:  (VITAMIN B-12) Inject 1,000 mcg into the muscle every 30 (thirty) days.   ketotifen 0.025 % ophthalmic solution Commonly known as:  ZADITOR Place 2 drops into both eyes 2 (two) times daily as needed (allergies).   lamoTRIgine 200 MG tablet Commonly known as:  LAMICTAL Take 200 mg by mouth 2 (two) times daily.   methocarbamol 500 MG tablet Commonly known as:  ROBAXIN Take 1 tablet (500 mg total) by mouth every 6 (six) hours as needed for muscle spasms.   OVER THE COUNTER MEDICATION Take 1-2 tablets by mouth See admin instructions. Livco Supplement - Take 1 tablet by mouth in the morning and take 2 tablets by mouth in the evening   oxyCODONE-acetaminophen 7.5-325 MG tablet Commonly known as:  PERCOCET Take 1 tablet by mouth every 4 (four) hours as needed for severe pain.   potassium chloride 10 MEQ tablet Commonly known as:  K-DUR Take 1 tablet (10 mEq total) by mouth daily.   propranolol ER 60 MG 24 hr capsule Commonly known as:  INDERAL LA Take 60 mg by mouth daily.   pyridostigmine 60 MG tablet Commonly known as:  MESTINON TAKE 0.5 TABLETS (30 MG TOTAL) BY MOUTH 3 (THREE) TIMES DAILY.   RABEprazole 20 MG tablet Commonly known as:  ACIPHEX Take 20 mg by mouth daily before breakfast.   sertraline 100 MG tablet Commonly known as:  ZOLOFT Take 100 mg by mouth daily.   SYSTANE OP Place 2 drops into both eyes 3 (three) times daily as needed (dry eyes).   traZODone 100 MG tablet Commonly known as:  DESYREL Take 300 mg by mouth at bedtime.   Vitamin D3 1000 units Caps Take 1,000 Units by mouth 2 (two) times daily.            Durable Medical Equipment  (From admission, onward)        Start     Ordered   07/11/17 0935  DME 3-in-1  Once     07/11/17 0934   07/11/17 0935  For home use only DME Walker rolling  [facility])  Once    Question:  Patient needs a walker to treat with the following condition  Answer:  Gait instability    07/11/17 0934     Follow-up Information    [facility] Follow up.   Specialty:  Home Health Services Why:  A representative from [facility] will contact you to arrange start date and time for your therapy. Contact information: [address] [place] [address] [phone]        Lisbeth Renshaw, MD. Schedule an appointment as soon as possible for a visit in 3 week(s).   Specialty:  Neurosurgery Contact information: [address] [place] [address] [phone]        Gordan Payment., MD. Schedule an appointment as soon  as possible for a visit in 1 week(s).   Specialty:  Internal Medicine Why:  Monitor low blood count and low potassium Contact information: [address] [place] [address] [phone]        [facility] Follow up.   Why:  Foley catheter Contact information: [address] [place] [address] [place] [address] [phone]          Signed: Alyson Ingles 07/11/2017, 9:35 AM

## 2017-08-14 ENCOUNTER — Telehealth: Payer: Self-pay | Admitting: Neurology

## 2017-08-14 ENCOUNTER — Ambulatory Visit: Payer: Medicare Other | Admitting: Neurology

## 2017-08-14 NOTE — Telephone Encounter (Signed)
This patient cancelled the same day of a RV appointment. 

## 2017-08-15 ENCOUNTER — Encounter: Payer: Self-pay | Admitting: Neurology

## 2017-08-21 ENCOUNTER — Telehealth: Payer: Self-pay | Admitting: Neurology

## 2017-08-21 NOTE — Telephone Encounter (Signed)
Pt's daughter pt had back surgery 5/16 and has been taking robaxin 500mg  4 x day or as needed. Daughter said she did not have vertigo since starting mestinon until she started the robaxin. She is wanting to know if the dose of robaxin is enough to cause an interaction. Please call to advise.

## 2017-08-21 NOTE — Telephone Encounter (Signed)
The patient was getting benefit from her Mestinon with a double vision, she went on Robaxin for her back, and now started having double vision and dizziness again.  It is possible the Robaxin may be having a bit of an anticholinergic effect, causing some dizziness.  We will need to get a revisit sometime in August.

## 2017-08-21 NOTE — Telephone Encounter (Signed)
Called daughter, Selena BattenKim. Scheduled appt for 09/29/17 at 12pm, check in 1130am. She verbalized understanding and appreciation.

## 2017-09-22 ENCOUNTER — Telehealth: Payer: Self-pay | Admitting: Neurology

## 2017-09-22 MED ORDER — PYRIDOSTIGMINE BROMIDE 60 MG PO TABS: 30.0000 mg | ORAL_TABLET | Freq: Three times a day (TID) | ORAL | 3 refills | Status: DC

## 2017-09-22 NOTE — Telephone Encounter (Signed)
Pt's daughter Kim/DPR request pyridostigmine (MESTINON) 60 MG tablet be sent to [facility]. She said they are trying to get away from using [facility].

## 2017-09-22 NOTE — Telephone Encounter (Signed)
A prescription for the Mestinon was sent in.

## 2017-09-29 ENCOUNTER — Ambulatory Visit (INDEPENDENT_AMBULATORY_CARE_PROVIDER_SITE_OTHER): Payer: Medicare Other | Admitting: Neurology

## 2017-09-29 ENCOUNTER — Encounter: Payer: Self-pay | Admitting: Neurology

## 2017-09-29 VITALS — BP 126/70 | HR 58 | Ht 64.0 in | Wt 160.0 lb

## 2017-09-29 DIAGNOSIS — R42 Dizziness and giddiness: Secondary | ICD-10-CM | POA: Diagnosis not present

## 2017-09-29 DIAGNOSIS — H532 Diplopia: Secondary | ICD-10-CM | POA: Diagnosis not present

## 2017-09-29 DIAGNOSIS — G43019 Migraine without aura, intractable, without status migrainosus: Secondary | ICD-10-CM

## 2017-09-29 NOTE — Progress Notes (Signed)
Reason for visit: Double vision, headache, vertigo  Robin Collier is an 67 y.o. female  History of present illness:  Robin Collier is a 67 year old right-handed white female with a history of an anxiety disorder.  The patient has an essential tremor, she takes propranolol for this.  She reports occasional migraine type headaches, she also has episodes of vertigo that may not always be associated with a headache.  The patient however indicates that bright light exposure will bring on episodes of vertigo and headache.  The patient has gained benefit with Mestinon with her double vision, as long as she takes this she does not have much troubles in this regard.  She returns to this office for an evaluation.  She had back surgery done by Dr. Conchita ParisNundkumar in May 2019, she has developed some buttock discomfort following the surgery.  Past Medical History:  Diagnosis Date  . Anxiety    Panic attacks  . Arthritis   . Bipolar disorder [facility](HCC)    one Dr says yes, one says no  . Claustrophobia   . Common migraine with intractable migraine 02/15/2017   Chronic daily headache  . Constipation   . Depression   . Diplopia 02/15/2017  . Diverticulitis   . Fatty liver   . Gait abnormality 02/15/2017  . GERD (gastroesophageal reflux disease)   . Headache(784.0)    migraines  . Hypertension    Dr Shary DecampGrisso told me that I do not have high blood preazaure and I am not on medication  . IBS (irritable bowel syndrome)   . Lumbosacral spondylosis with radiculopathy   . Mental disorder   . Restless legs syndrome   . Seizures (HCC)    "not really seziure"  . Tremor, essential 02/15/2017  . Tremors of nervous system   . Vertigo    06/06/16-  last 1. 5 years    Past Surgical History:  Procedure Laterality Date  . ABDOMINAL HYSTERECTOMY  '90  . BACK SURGERY  2009   Discectomy  . BLADDER SUSPENSION    . COLONOSCOPY W/ POLYPECTOMY    . EYE SURGERY Bilateral    Cataract  . JOINT REPLACEMENT Right      Knee   . RADIOLOGY WITH ANESTHESIA N/A 06/07/2016   Procedure: MRI LUMBAR SPINE WITHOUT CONTRAST;  Surgeon: Medication Radiologist, MD;  Location: [facility] OR;  Service: Radiology;  Laterality: N/A;  . RADIOLOGY WITH ANESTHESIA N/A 11/17/2016   Procedure: MRI OF BRAIN WITH AND WITHOUT CONTRAST;  Surgeon: Radiologist, Medication, MD;  Location: [facility] OR;  Service: Radiology;  Laterality: N/A;  . RADIOLOGY WITH ANESTHESIA N/A 05/25/2017   Procedure: MRI WITH ANESTHESIA OF LUMBAR SPINE WITH AND WITHOUT;  Surgeon: Radiologist, Medication, MD;  Location: [facility] OR;  Service: Radiology;  Laterality: N/A;    Family History  Problem Relation Age of Onset  . Cancer Mother   . Cancer Father   . Diabetes Father     Social history:  reports that she has never smoked. She has never used smokeless tobacco. She reports that she does not drink alcohol or use drugs.    Allergies  Allergen Reactions  . Seroquel [Quetiapine Fumarate] Nausea Only and Other (See Comments)    SEIZURE-LIKE RESPONSES VERTIGO  . Gabapentin Nausea And Vomiting and Other (See Comments)    Lightheadness  . Hydrocodone Other (See Comments)    Dizziness    Medications:  Prior to Admission medications   Medication Sig Start Date End Date Taking? Authorizing Provider  acetaminophen (TYLENOL) 500 MG tablet Take 1,000-1,500 mg by mouth 3 (three) times daily as needed for moderate pain.   Yes [provider]  Cholecalciferol (VITAMIN D3) 1000 units CAPS Take 1,000 Units by mouth 2 (two) times daily.    Yes [provider]  clonazePAM (KLONOPIN) 0.5 MG tablet Take 0.5 mg by mouth 2 (two) times daily.    Yes [provider]  cyanocobalamin (,VITAMIN B-12,) 1000 MCG/ML injection Inject 1,000 mcg into the muscle every 30 (thirty) days.   Yes [provider]  cyclobenzaprine (FLEXERIL) 10 MG tablet Take 10 mg by mouth 3 (three) times daily as needed. 09/26/17  Yes [provider]  lamoTRIgine (LAMICTAL)  200 MG tablet Take 200 mg by mouth 2 (two) times daily.    Yes [provider]  OVER THE COUNTER MEDICATION Take 1-2 tablets by mouth See admin instructions. Livco Supplement - Take 1 tablet by mouth in the morning and take 2 tablets by mouth in the evening   Yes [provider]  Polyethyl Glycol-Propyl Glycol (SYSTANE OP) Place 2 drops into both eyes 3 (three) times daily as needed (dry eyes).    Yes [provider]  potassium chloride (K-DUR) 10 MEQ tablet Take 1 tablet (10 mEq total) by mouth daily. 07/11/17  Yes Costella, Darci Current, PA-C  propranolol ER (INDERAL LA) 60 MG 24 hr capsule Take 60 mg by mouth daily.    Yes [provider]  pyridostigmine (MESTINON) 60 MG tablet Take 0.5 tablets (30 mg total) by mouth 3 (three) times daily. 09/22/17  Yes York Spaniel, MD  RABEprazole (ACIPHEX) 20 MG tablet Take 20 mg by mouth daily before breakfast.    Yes [provider]  sertraline (ZOLOFT) 100 MG tablet Take 100 mg by mouth daily.    Yes [provider]  traZODone (DESYREL) 100 MG tablet Take 300 mg by mouth at bedtime.    Yes [provider]    ROS:  Out of a complete 14 system review of symptoms, the patient complains only of the following symptoms, and all other reviewed systems are negative.  Light sensitivity, double vision, eye pain, blurred vision Excessive thirst Rectal pain Restless legs Back pain, muscle cramps, walking difficulty Dizziness, headache, speech difficulty, tremors, facial drooping Confusion, depression, anxiety  Blood pressure 126/70, pulse (!) 58, height 5\' 4"  (1.626 m), weight 160 lb (72.6 kg).  Physical Exam  General: The patient is alert and cooperative at the time of the examination.  The patient is moderately obese.  The patient has a flat affect.  Skin: No significant peripheral edema is noted.   Neurologic Exam  Mental status: The patient is alert and oriented x 3 at the time of the  examination. The patient has apparent normal recent and remote memory, with an apparently normal attention span and concentration ability.   Cranial nerves: Facial symmetry is present. Speech is normal, no aphasia or dysarthria is noted. Extraocular movements are full. Visual fields are full.  Motor: The patient has good strength in all 4 extremities.  Sensory examination: Soft touch sensation is symmetric on the face, arms, and legs.  Coordination: The patient has good finger-nose-finger and heel-to-shin bilaterally.  Gait and station: The patient has a normal gait. Tandem gait is slightly unsteady. Romberg is negative. No drift is seen.  Reflexes: Deep tendon reflexes are symmetric.   Assessment/Plan:  1.  Double vision  2.  Essential tremor  3.  Headache, migraine  4.  Episodic vertigo  5.  Anxiety disorder  The patient may be having migraine events that can be associated with vertigo.  The patient does not wish to try medication such as Topamax for her vertigo and headache.  The patient will follow-up through this office in 6 months, she will continue the Mestinon as this does seem to help her double vision.  Marlan Palau MD 09/29/2017 12:42 PM  [facility] [address] [place], [place] [address]  Phone [phone] Fax [phone]

## 2017-12-19 ENCOUNTER — Telehealth: Payer: Self-pay | Admitting: Neurology

## 2017-12-19 MED ORDER — PYRIDOSTIGMINE BROMIDE 60 MG PO TABS: ORAL_TABLET | ORAL | 3 refills | Status: DC

## 2017-12-19 NOTE — Telephone Encounter (Signed)
Spoke with pt. She confirmed she is taking Mestinon correctly. Sts. over the last 2 wks, she has had daily intermittent dizziness, double vision, diarrhea, nausea. Wonders if Mestinon needs to be adjusted. Will check with Dr. Ignacia Bayley

## 2017-12-19 NOTE — Addendum Note (Signed)
Addended by: York Spaniel on: 12/19/2017 04:09 PM   Modules accepted: Orders

## 2017-12-19 NOTE — Telephone Encounter (Signed)
I called the patient, left a message, I will call back later. 

## 2017-12-19 NOTE — Telephone Encounter (Signed)
I called the patient.  The patient indicates that she is having onset of double vision that generally occurs around 12 noon, she takes half of the Mestinon at 10 AM, 2 PM, and with dinner.  She has also had increased frequency of her headaches over the last 2 to 3 weeks, they are now daily in nature.  The feeling of nausea and imbalance comes on when the double vision starts, we will go up on the Mestinon taking a full tablet at 10 AM, half tablet at 2 PM and 1/2 tablet at dinner.  The patient will call for any dose adjustments.

## 2017-12-19 NOTE — Telephone Encounter (Signed)
LMTC./fim 

## 2017-12-19 NOTE — Telephone Encounter (Signed)
Pt has called back to speak with Dr Anne Hahn, she was told he will try her later.  Pt is asking he call her landline [phone]

## 2017-12-19 NOTE — Telephone Encounter (Signed)
Patient has been taking pyridostigmine (MESTINON) 60 MG tablet since August. For the last 2 weeks she has been experiencing dizziness, double vision and is very sensitive to light. Could these symptoms be coming from the medication?

## 2017-12-27 ENCOUNTER — Other Ambulatory Visit: Payer: Self-pay | Admitting: Physician Assistant

## 2017-12-27 DIAGNOSIS — M4727 Other spondylosis with radiculopathy, lumbosacral region: Secondary | ICD-10-CM

## 2018-01-15 ENCOUNTER — Ambulatory Visit
Admission: RE | Admit: 2018-01-15 | Discharge: 2018-01-15 | Disposition: A | Discharge status: Home / Self Care | Payer: Medicare Other | Source: Ambulatory Visit | Attending: Physician Assistant | Admitting: Physician Assistant

## 2018-01-15 DIAGNOSIS — M4727 Other spondylosis with radiculopathy, lumbosacral region: Secondary | ICD-10-CM

## 2018-01-15 MED ORDER — IOPAMIDOL (ISOVUE-M 200) INJECTION 41%
1.0000 mL | Freq: Once | INTRAMUSCULAR | Status: AC
  Administered 2018-01-15: 1 mL via EPIDURAL

## 2018-01-15 MED ORDER — METHYLPREDNISOLONE ACETATE 40 MG/ML INJ SUSP (RADIOLOG
120.0000 mg | Freq: Once | INTRAMUSCULAR | Status: AC
  Administered 2018-01-15: 120 mg via EPIDURAL

## 2018-01-15 NOTE — Discharge Instructions (Signed)

## 2018-01-29 ENCOUNTER — Ambulatory Visit: Payer: Medicare Other | Admitting: Neurology

## 2018-01-30 HISTORY — PX: RETINAL DETACHMENT SURGERY: SHX105

## 2018-04-09 ENCOUNTER — Ambulatory Visit (INDEPENDENT_AMBULATORY_CARE_PROVIDER_SITE_OTHER): Payer: Medicare Other | Admitting: Neurology

## 2018-04-09 ENCOUNTER — Encounter: Payer: Self-pay | Admitting: Neurology

## 2018-04-09 VITALS — BP 167/80 | HR 80 | Ht 64.0 in | Wt 171.3 lb

## 2018-04-09 DIAGNOSIS — G43019 Migraine without aura, intractable, without status migrainosus: Secondary | ICD-10-CM

## 2018-04-09 DIAGNOSIS — H532 Diplopia: Secondary | ICD-10-CM

## 2018-04-09 DIAGNOSIS — G250 Essential tremor: Secondary | ICD-10-CM | POA: Diagnosis not present

## 2018-04-09 MED ORDER — TOPIRAMATE 25 MG PO TABS: ORAL_TABLET | ORAL | 3 refills | Status: DC

## 2018-04-09 MED ORDER — PYRIDOSTIGMINE BROMIDE 60 MG PO TABS: 60.0000 mg | ORAL_TABLET | Freq: Three times a day (TID) | ORAL | 1 refills | Status: DC

## 2018-04-09 NOTE — Patient Instructions (Signed)
We will increase the mestinon to 60 mg three times a day.  We will start Topamax for the headache.  Topamax (topiramate) is a seizure medication that has an FDA approval for seizures and for migraine headache. Potential side effects of this medication include weight loss, cognitive slowing, tingling in the fingers and toes, and carbonated drinks will taste bad. If any significant side effects are noted on this drug, please contact our office.

## 2018-04-09 NOTE — Progress Notes (Signed)
Reason for visit: Dizziness, double vision, headache  Robin Collier is an 68 y.o. female  History of present illness:  [name] is a 68 year old right-handed white female with a history of chronic daily headaches.  The patient reports episodes of vertigo, she has had some problems with double vision.  She claims that when she looks straight ahead that she does not have double vision, she will get double vision looking from one side to the next.  She gets dizzy with the double vision but it is different from the vertigo.  She reports gait instability at times, she has an essential tremor, she is on propranolol for this.  She has noted that clonazepam seems to help her dizziness when she takes it.  The patient initially had benefit with the Mestinon with her double vision but now she does not believe that it is helping much.  She otherwise is tolerating the medication.  Past Medical History:  Diagnosis Date  . Anxiety    Panic attacks  . Arthritis   . Bipolar disorder [facility](HCC)    one Dr says yes, one says no  . Claustrophobia   . Common migraine with intractable migraine 02/15/2017   Chronic daily headache  . Constipation   . Depression   . Diplopia 02/15/2017  . Diverticulitis   . Fatty liver   . Gait abnormality 02/15/2017  . GERD (gastroesophageal reflux disease)   . Headache(784.0)    migraines  . Hypertension    Dr Shary DecampGrisso told me that I do not have high blood preazaure and I am not on medication  . IBS (irritable bowel syndrome)   . Lumbosacral spondylosis with radiculopathy   . Mental disorder   . Restless legs syndrome   . Seizures (HCC)    "not really seziure"  . Tremor, essential 02/15/2017  . Tremors of nervous system   . Vertigo    06/06/16-  last 1. 5 years    Past Surgical History:  Procedure Laterality Date  . ABDOMINAL HYSTERECTOMY  '90  . BACK SURGERY  2009   Discectomy  . BLADDER SUSPENSION    . COLONOSCOPY W/ POLYPECTOMY    . EYE SURGERY  Bilateral    Cataract  . JOINT REPLACEMENT Right    Knee   . RADIOLOGY WITH ANESTHESIA N/A 06/07/2016   Procedure: MRI LUMBAR SPINE WITHOUT CONTRAST;  Surgeon: Medication Radiologist, MD;  Location: [facility] OR;  Service: Radiology;  Laterality: N/A;  . RADIOLOGY WITH ANESTHESIA N/A 11/17/2016   Procedure: MRI OF BRAIN WITH AND WITHOUT CONTRAST;  Surgeon: Radiologist, Medication, MD;  Location: [facility] OR;  Service: Radiology;  Laterality: N/A;  . RADIOLOGY WITH ANESTHESIA N/A 05/25/2017   Procedure: MRI WITH ANESTHESIA OF LUMBAR SPINE WITH AND WITHOUT;  Surgeon: Radiologist, Medication, MD;  Location: [facility] OR;  Service: Radiology;  Laterality: N/A;  . RETINAL DETACHMENT SURGERY Left 01/30/2018    Family History  Problem Relation Age of Onset  . Cancer Mother   . Cancer Father   . Diabetes Father     Social history:  reports that she has never smoked. She has never used smokeless tobacco. She reports that she does not drink alcohol or use drugs.    Allergies  Allergen Reactions  . Seroquel [Quetiapine Fumarate] Nausea Only and Other (See Comments)    SEIZURE-LIKE RESPONSES VERTIGO  . Gabapentin Nausea And Vomiting and Other (See Comments)    Lightheadness  . Hydrocodone Other (See Comments)    Dizziness  Medications:  Prior to Admission medications   Medication Sig Start Date End Date Taking? Authorizing Provider  acetaminophen (TYLENOL) 500 MG tablet Take 1,000-1,500 mg by mouth 3 (three) times daily as needed for moderate pain.   Yes [provider]  clonazePAM (KLONOPIN) 0.5 MG tablet Take 0.5 mg by mouth 2 (two) times daily.    Yes [provider]  cyanocobalamin (,VITAMIN B-12,) 1000 MCG/ML injection Inject 1,000 mcg into the muscle every 30 (thirty) days.   Yes [provider]  lamoTRIgine (LAMICTAL) 200 MG tablet Take 200 mg by mouth 2 (two) times daily.    Yes [provider]  propranolol ER (INDERAL LA) 60 MG 24 hr capsule Take 60 mg by mouth  daily.    Yes [provider]  pyridostigmine (MESTINON) 60 MG tablet Take 1 tablet (60 mg total) by mouth 3 (three) times daily. 04/09/18  Yes York Spaniel, MD  RABEprazole (ACIPHEX) 20 MG tablet Take 20 mg by mouth daily before breakfast.    Yes [provider]  sertraline (ZOLOFT) 100 MG tablet Take 100 mg by mouth daily.    Yes [provider]  traZODone (DESYREL) 100 MG tablet Take 300 mg by mouth at bedtime.    Yes [provider]  topiramate (TOPAMAX) 25 MG tablet Take one tablet at night for one week, then take 2 tablets at night for one week, then take 3 tablets at night. 04/09/18   York Spaniel, MD    ROS:  Out of a complete 14 system review of symptoms, the patient complains only of the following symptoms, and all other reviewed systems are negative.  Eye itching, light sensitivity, double vision, eye pain, blurred vision Ear pain Wheezing, shortness of breath Excessive thirst Swollen abdomen, constipation, diarrhea Restless legs Environmental allergies Painful urination, urinary urgency Back pain Dizziness, headache, weakness, tremors Depression, anxiety  Blood pressure (!) 167/80, pulse 80, height 5\' 4"  (1.626 m), weight 171 lb 5 oz (77.7 kg).  Physical Exam  General: The patient is alert and cooperative at the time of the examination.  The patient is moderately obese.  Skin: No significant peripheral edema is noted.   Neurologic Exam  Mental status: The patient is alert and oriented x 3 at the time of the examination. The patient has apparent normal recent and remote memory, with an apparently normal attention span and concentration ability.   Cranial nerves: Facial symmetry is present. Speech is normal, no aphasia or dysarthria is noted. Extraocular movements are full. Visual fields are full.  Motor: The patient has good strength in all 4 extremities.  Sensory examination: Soft touch sensation is symmetric on the  face, arms, and legs.  Coordination: The patient has good finger-nose-finger and heel-to-shin bilaterally.  Occasional tremors are seen with finger-nose-finger bilaterally.  Gait and station: The patient has a staggery gait, unable to do tandem gait.  Romberg is unsteady.  Reflexes: Deep tendon reflexes are symmetric.   Assessment/Plan:  1.  Double vision  2.  Chronic daily headache  3.  Vertigo  4.  Gait instability  5.  Essential tremor  Some of the features of her gait instability today look functional in nature.  The patient is reporting ongoing chronic daily headache, we will try her on Topamax for the headache, she is already on propranolol.  The Topamax may also help the essential tremor.  The patient claims that she had a retinal detachment on the right that was repaired.  The patient  will be increased on the Mestinon for the double vision going to 60 mg 3 times daily.  She will follow-up in 6 months.  Marlan Palau MD 04/09/2018 12:46 PM  [facility] [address] [place], [place] [address]  Phone [phone] Fax [phone]

## 2018-04-25 ENCOUNTER — Telehealth: Payer: Self-pay | Admitting: Neurology

## 2018-04-25 MED ORDER — TOPIRAMATE 25 MG PO TABS: 25.0000 mg | ORAL_TABLET | Freq: Every day | ORAL | 3 refills | Status: DC

## 2018-04-25 NOTE — Addendum Note (Signed)
Addended by: York Spaniel on: 04/25/2018 12:48 PM   Modules accepted: Orders

## 2018-04-25 NOTE — Telephone Encounter (Signed)
Pt said since increasing topamax to 2 tabs at night this week, she has increased dizziness. Pt states since increasing pyridostigmine (MESTINON) 60 MG tablet she feels it is too strong. I had difficulty in getting how she is taking this medication. She is unsure if increasing both meds has caused the increase in the dizziness. Please call to advise at [phone]

## 2018-04-25 NOTE — Telephone Encounter (Signed)
I called the patient.  The patient cannot tolerate 50 mg of Topamax at night, she feels too drowsy in the morning and increased dizziness.  She did tolerate the 25 mg nightly dose, her headaches have improved on the medication.  We will cut back to 25 mg at night, she will call me if she still cannot tolerate the drug.

## 2018-04-25 NOTE — Telephone Encounter (Signed)
I called the patient, left a message on home and cell phone, I will call back later.

## 2018-05-21 ENCOUNTER — Telehealth: Payer: Self-pay | Admitting: Neurology

## 2018-05-21 NOTE — Telephone Encounter (Signed)
Pt has called to inform that she has had a migraine for the last 2-3 days.  Pt states she is unable to tolerate topiramate (TOPAMAX) 25 MG tablet . She states she has taken pyridostigmine (MESTINON) 60 MG tablet last night and wants to know if she should continue to do so, please call While speaking with pt she said she has also been vomiting.  Pt's speech is very slurred and drawn out.  Pt was asked if she needed to contact 911 she said she did last week.  Pt said she didn't know if she should call them.  Pt was asked to hold while phone rep attempted to reach RN, pt agreed to hold but hung up.

## 2018-05-21 NOTE — Telephone Encounter (Signed)
I called the patient again, left a message again, I will call back later. 

## 2018-05-21 NOTE — Telephone Encounter (Signed)
I contacted the pt. She states for the past 3 days she has had a severe migraine with vomiting. Pt denies any extremity numbness but states her legs are week and she is unable to stand ( pt states this has been present for 1 week). Pt states she is not taking the topamax  25 mg any longer because it made her drowsy. Pt confirmed she is still taking the Mestinon 60 mg and has been taking tylenol as well. Pt's speech was muffled/drawn out at times but pt states she did not sleep well last night.   Pt states in the past she has taken Neurontin but did not have much benefit.

## 2018-05-21 NOTE — Telephone Encounter (Signed)
I called back a third time, I left a message, the patient is to contact our office and give [name] a number where she can be reached if she still requires assistance.

## 2018-05-21 NOTE — Telephone Encounter (Signed)
I tried to call the patient, left messages at the home phone and cell phone.  I will call back later.  The patient is having ongoing problems with a headache, in the past she has been on propranolol, Topamax, she has taken gabapentin without much benefit.  May try Effexor for her headache.  The patient could be a candidate for Aimovig or Botox in the future.

## 2018-05-28 ENCOUNTER — Telehealth: Payer: Self-pay | Admitting: Neurology

## 2018-05-28 MED ORDER — PYRIDOSTIGMINE BROMIDE 60 MG PO TABS: 60.0000 mg | ORAL_TABLET | Freq: Three times a day (TID) | ORAL | 1 refills | Status: DC

## 2018-05-28 NOTE — Telephone Encounter (Signed)
I reviewed the chart and refill is appropriate. Rx has been [facility] as a [facility].

## 2018-05-28 NOTE — Telephone Encounter (Signed)
Pt is requesting a refill of pyridostigmine (MESTINON) 60 MG tablet to be sent to  [facility] - [place], [place] - [address]

## 2018-06-13 ENCOUNTER — Telehealth: Payer: Self-pay | Admitting: Neurology

## 2018-06-13 DIAGNOSIS — R001 Bradycardia, unspecified: Secondary | ICD-10-CM

## 2018-06-13 DIAGNOSIS — R112 Nausea with vomiting, unspecified: Secondary | ICD-10-CM

## 2018-06-13 DIAGNOSIS — R42 Dizziness and giddiness: Secondary | ICD-10-CM

## 2018-06-13 DIAGNOSIS — N390 Urinary tract infection, site not specified: Secondary | ICD-10-CM | POA: Diagnosis not present

## 2018-06-13 DIAGNOSIS — G250 Essential tremor: Secondary | ICD-10-CM

## 2018-06-13 DIAGNOSIS — R531 Weakness: Secondary | ICD-10-CM

## 2018-06-13 DIAGNOSIS — E876 Hypokalemia: Secondary | ICD-10-CM

## 2018-06-13 MED ORDER — ERENUMAB-AOOE 140 MG/ML ~~LOC~~ SOAJ
140.0000 mg | SUBCUTANEOUS | 4 refills | Status: DC
Start: 2018-06-13 — End: 2018-06-20

## 2018-06-13 NOTE — Telephone Encounter (Signed)
I called the patient.  I talk with the daughter.  The patient could not tolerate Topamax, she is off the medication, it did not help her headache.  In the past she could not tolerate gabapentin, she has been on propranolol but still continues to have frequent headaches.  The Mestinon is not helping her double vision.  The patient will stop the Mestinon.  We will try Aimovig for the headache.  The patient is currently being admitted to the hospital today with a urinary tract infection.

## 2018-06-13 NOTE — Telephone Encounter (Signed)
Pt's daughter Selena Batten on [place] states that the pt is still getting the headaches and is still experiencing Vertigo. Pt's daughter would like to know what else can be done for the pt. Please advise.

## 2018-06-13 NOTE — Telephone Encounter (Signed)
I contacted the pt's daughter Selena Batten, ok per dpr. She wanted to notify MD pt is still experiencing headaches/veritgo. She states over the past month the pt has had an increase in both.  I advised pt's daughter on 05/21/18 pt called in to report similar symptoms and Dr. Anne Hahn was unable to touch base with her even after leaving several messages for the pt to call back.  Daughter states she was unaware of this and reports the pt seldomly uses her phone.   Daughter reports the 60 mg mestinon has not been beneficial in treating her double vision and her mother would like to discuss medication changes.  Daughter is asking for a call back on # [phone]. She states she will be with her mother the rest of the evening.

## 2018-06-14 DIAGNOSIS — R112 Nausea with vomiting, unspecified: Secondary | ICD-10-CM | POA: Diagnosis not present

## 2018-06-14 DIAGNOSIS — N390 Urinary tract infection, site not specified: Secondary | ICD-10-CM | POA: Diagnosis not present

## 2018-06-14 DIAGNOSIS — R42 Dizziness and giddiness: Secondary | ICD-10-CM | POA: Diagnosis not present

## 2018-06-14 DIAGNOSIS — R531 Weakness: Secondary | ICD-10-CM | POA: Diagnosis not present

## 2018-06-15 DIAGNOSIS — N390 Urinary tract infection, site not specified: Secondary | ICD-10-CM | POA: Diagnosis not present

## 2018-06-15 DIAGNOSIS — R531 Weakness: Secondary | ICD-10-CM | POA: Diagnosis not present

## 2018-06-15 DIAGNOSIS — R42 Dizziness and giddiness: Secondary | ICD-10-CM | POA: Diagnosis not present

## 2018-06-15 DIAGNOSIS — R112 Nausea with vomiting, unspecified: Secondary | ICD-10-CM | POA: Diagnosis not present

## 2018-06-20 ENCOUNTER — Telehealth: Payer: Self-pay | Admitting: Neurology

## 2018-06-20 MED ORDER — ERENUMAB-AOOE 140 MG/ML ~~LOC~~ SOAJ: 140.0000 mg | SUBCUTANEOUS | 1 refills | Status: DC

## 2018-06-20 NOTE — Telephone Encounter (Signed)
Pt daughter has called asking if there is a status update on the approval of the aimovig.  Please call once approval is made

## 2018-06-20 NOTE — Telephone Encounter (Signed)
Patient's daughter calling about  Aimovig

## 2018-06-20 NOTE — Telephone Encounter (Signed)
Kim (ok per dpr:) has been notified of approval.

## 2018-06-20 NOTE — Telephone Encounter (Signed)
I contacted the pt's daughter kim. She states the aimovig was going to cost for the pt would be 500$ for a 30 day supply. Daughter [facility] and for a 90 day supply it would cost a little over 100$. Daughter requested rx to be sent. I have [facility].

## 2018-06-20 NOTE — Telephone Encounter (Signed)
PA for Aimovig has been submitted via cover my meds and received instant approval. Your request has been approved  Case BS:[id number]; Status:Approved Review Type:Prior Auth Coverage Start Date:05/21/2018;Coverage End Date:06/20/2019;

## 2018-06-20 NOTE — Telephone Encounter (Signed)
Patient daughter Selena Batten called and would like a cal back to discuss the aimovig. Her phone number is [phone]

## 2018-06-26 NOTE — Telephone Encounter (Signed)
Pt daughter called in and wanted to know if the Aimovig Injection can be given in office tomorrow  CB# [phone]

## 2018-06-26 NOTE — Telephone Encounter (Signed)
I contacted the pt's daughter Selena Batten ( ok per dpr). I advised, due to current COVID 19 pandemic, our office is severely reducing in office visits until further notice, in order to minimize the risk to our patients and healthcare providers.   I was able to provide education on how to use the aimovig pen.  I advised pt's daughter this medication is given in the SQ sites. Good location would be the belly or upper part of the thigh. I advised the aimovig has a cap you twist off and once site it determined to press pen firmly against skin surface and inject medication. Pt should hold injectable for about 20 seconds before removing.   Daughter was encouraged to pull the pen out and familiarize her self with the device before injecting.  Daughter states she has administered vit b 12 in the past and struggled. I advised since vit b 12 is a intra muscular injection SQ is normally a little bit easier to administer.   She verbalized understanding and stated she will call back if she has any further questions.

## 2018-11-05 ENCOUNTER — Ambulatory Visit (INDEPENDENT_AMBULATORY_CARE_PROVIDER_SITE_OTHER): Payer: Medicare Other | Admitting: Neurology

## 2018-11-05 ENCOUNTER — Other Ambulatory Visit: Payer: Self-pay

## 2018-11-05 ENCOUNTER — Encounter: Payer: Self-pay | Admitting: Neurology

## 2018-11-05 VITALS — BP 148/77 | HR 62 | Temp 98.0°F | Ht 64.0 in | Wt 181.1 lb

## 2018-11-05 DIAGNOSIS — G43019 Migraine without aura, intractable, without status migrainosus: Secondary | ICD-10-CM

## 2018-11-05 DIAGNOSIS — H532 Diplopia: Secondary | ICD-10-CM | POA: Diagnosis not present

## 2018-11-05 DIAGNOSIS — G250 Essential tremor: Secondary | ICD-10-CM

## 2018-11-05 DIAGNOSIS — R269 Unspecified abnormalities of gait and mobility: Secondary | ICD-10-CM | POA: Diagnosis not present

## 2018-11-05 MED ORDER — PRIMIDONE 50 MG PO TABS: 50.0000 mg | ORAL_TABLET | Freq: Every day | ORAL | 3 refills | Status: DC

## 2018-11-05 NOTE — Progress Notes (Signed)
Reason for visit: Essential tremor, headache, dizziness, double vision  Robin Collier is an 68 y.o. female  History of present illness:  Robin Collier is a 68 year old right-handed white female with a history of an essential tremor, treated with propranolol.  In the past, she has not tolerated Topamax, she is on clonazepam taking 0.5 mg twice daily.  The patient has some gait instability, she fell while in the yard 2 weeks ago but did not sustain injury.  She continues to have daily headaches, she has not gained benefit with propranolol or Topamax, she has been on Norvasc and trazodone without benefit.  She has not been able to tolerate gabapentin.  She is on Aimovig, this has reduced the severity of her headaches but she continues to have headaches every day.  She returns this office for an evaluation.  Past Medical History:  Diagnosis Date  . Anxiety    Panic attacks  . Arthritis   . Bipolar disorder [facility])    one Dr says yes, one says no  . Claustrophobia   . Common migraine with intractable migraine 02/15/2017   Chronic daily headache  . Constipation   . Depression   . Diplopia 02/15/2017  . Diverticulitis   . Fatty liver   . Gait abnormality 02/15/2017  . GERD (gastroesophageal reflux disease)   . Headache(784.0)    migraines  . Hypertension    Dr Shary Decamp told me that I do not have high blood preazaure and I am not on medication  . IBS (irritable bowel syndrome)   . Lumbosacral spondylosis with radiculopathy   . Mental disorder   . Restless legs syndrome   . Seizures (HCC)    "not really seziure"  . Tremor, essential 02/15/2017  . Tremors of nervous system   . Vertigo    06/06/16-  last 1. 5 years    Past Surgical History:  Procedure Laterality Date  . ABDOMINAL HYSTERECTOMY  '90  . BACK SURGERY  2009   Discectomy  . BLADDER SUSPENSION    . COLONOSCOPY W/ POLYPECTOMY    . EYE SURGERY Bilateral    Cataract  . JOINT REPLACEMENT Right    Knee   .  RADIOLOGY WITH ANESTHESIA N/A 06/07/2016   Procedure: MRI LUMBAR SPINE WITHOUT CONTRAST;  Surgeon: Medication Radiologist, MD;  Location: [facility] OR;  Service: Radiology;  Laterality: N/A;  . RADIOLOGY WITH ANESTHESIA N/A 11/17/2016   Procedure: MRI OF BRAIN WITH AND WITHOUT CONTRAST;  Surgeon: Radiologist, Medication, MD;  Location: [facility] OR;  Service: Radiology;  Laterality: N/A;  . RADIOLOGY WITH ANESTHESIA N/A 05/25/2017   Procedure: MRI WITH ANESTHESIA OF LUMBAR SPINE WITH AND WITHOUT;  Surgeon: Radiologist, Medication, MD;  Location: [facility] OR;  Service: Radiology;  Laterality: N/A;  . RETINAL DETACHMENT SURGERY Left 01/30/2018    Family History  Problem Relation Age of Onset  . Cancer Mother   . Cancer Father   . Diabetes Father     Social history:  reports that she has never smoked. She has never used smokeless tobacco. She reports that she does not drink alcohol or use drugs.    Allergies  Allergen Reactions  . Seroquel [Quetiapine Fumarate] Nausea Only and Other (See Comments)    SEIZURE-LIKE RESPONSES VERTIGO  . Gabapentin Nausea And Vomiting and Other (See Comments)    Lightheadness  . Hydrocodone Other (See Comments)    Dizziness    Medications:  Prior to Admission medications   Medication Sig Start Date  End Date Taking? Authorizing Provider  acetaminophen (TYLENOL) 500 MG tablet Take 1,000-1,500 mg by mouth 3 (three) times daily as needed for moderate pain.   Yes [provider]  amLODipine (NORVASC) 5 MG tablet Take 5 mg by mouth daily. 10/12/18  Yes [provider]  clonazePAM (KLONOPIN) 0.5 MG tablet Take 0.5 mg by mouth 2 (two) times daily.    Yes [provider]  cyanocobalamin (,VITAMIN B-12,) 1000 MCG/ML injection Inject 1,000 mcg into the muscle every 30 (thirty) days.   Yes [provider]  Erenumab-aooe (AIMOVIG) 140 MG/ML SOAJ Inject 140 mg into the skin every 30 (thirty) days. 06/20/18  Yes Kathrynn Ducking, MD  lamoTRIgine (LAMICTAL)  200 MG tablet Take 200 mg by mouth 2 (two) times daily.    Yes [provider]  propranolol ER (INDERAL LA) 60 MG 24 hr capsule Take 60 mg by mouth daily.    Yes [provider]  RABEprazole (ACIPHEX) 20 MG tablet Take 20 mg by mouth daily before breakfast.    Yes [provider]  ropinirole (REQUIP) 5 MG tablet Take 5 mg by mouth daily. Been taking at night   Yes [provider]  sertraline (ZOLOFT) 100 MG tablet Take 100 mg by mouth daily.    Yes [provider]  traZODone (DESYREL) 100 MG tablet Take 300 mg by mouth at bedtime.    Yes [provider]  pyridostigmine (MESTINON) 60 MG tablet Take 1 tablet (60 mg total) by mouth 3 (three) times daily. Patient not taking: Reported on 11/05/2018 05/28/18   Kathrynn Ducking, MD    ROS:  Out of a complete 14 system review of symptoms, the patient complains only of the following symptoms, and all other reviewed systems are negative.  Headache, walking problem, dizziness Depression  Blood pressure (!) 148/77, pulse 62, temperature 98 F (36.7 C), temperature source Temporal, height 5\' 4"  (1.626 m), weight 181 lb 2 oz (82.2 kg).  Physical Exam  General: The patient is alert and cooperative at the time of the examination.  The patient is moderately obese.  Skin: No significant peripheral edema is noted.   Neurologic Exam  Mental status: The patient is alert and oriented x 3 at the time of the examination. The patient has apparent normal recent and remote memory, with an apparently normal attention span and concentration ability.   Cranial nerves: Facial symmetry is present. Speech is normal, no aphasia or dysarthria is noted. Extraocular movements are full. Visual fields are full.  Motor: The patient has good strength in all 4 extremities.  Sensory examination: Soft touch sensation is symmetric on the face, arms, and legs.  Coordination: The patient has good finger-nose-finger and  heel-to-shin bilaterally.  There is some mild tremor with finger-nose-finger bilaterally.  The patient is able to draw a spiral with minimal tremor.  Gait and station: The patient has a slightly wide-based, unsteady gait.  Tandem gait is unsteady.  Romberg is negative but is unsteady.  Reflexes: Deep tendon reflexes are symmetric.   Assessment/Plan:  1.  Essential tremor  2.  Gait instability  3.  Chronic daily headache  4.  Episodic dizziness  5.  Episodic double vision  The patient will be given a trial of Mysoline for the tremor taking 50 mg at night.  She will stop the Aimovig and we will try her on Botox therapy for the daily headaches.  She will follow-up here in 6 months.  Jill Alexanders MD  11/05/2018 4:02 PM  [facility] [address] [place] [id number]  Phone [phone] Fax [phone]

## 2018-11-05 NOTE — Patient Instructions (Signed)
We will start primidone 50 mg at night for the tremor.  We will get you set up for Botox injections for the headache.

## 2019-01-21 ENCOUNTER — Telehealth: Payer: Self-pay | Admitting: Neurology

## 2019-01-21 NOTE — Telephone Encounter (Signed)
If pt calls back there is no record of our office or Dr. Jannifer Franklin prescribing the medication. We call the listed pharmacy and its not on her medication profile. Also we check the narcotic web site and there is no listing of the medication on the printout on the registery.

## 2019-01-21 NOTE — Telephone Encounter (Signed)
Pt has called to see if she can get a new script for Tramadol, please call

## 2019-01-21 NOTE — Telephone Encounter (Signed)
I called pt about our office having no record of prescribing her tramadol. I stated we reviewed the narcotic web site and tramadol is not listed on the registry. I advise pt to call the MD who manages her tramadol medication. PT stated she wanted a refill on her right leg for possible nerve pain which was topamax. I stated per the last note she stop taking it because she could not tolerate it. I stated a call will be made to Dr Leonie Man the work in MD for review. PT verbalized understanding.

## 2019-01-21 NOTE — Telephone Encounter (Signed)
Dr Tobey Grim last 2 visits do not mention leg pain. Incase this is anew problem she can start with her primary MD. Hulan Saas he/she feels this is neurological they can refer to Dr Jannifer Franklin for a new problem

## 2019-01-22 NOTE — Telephone Encounter (Signed)
I called pt that Dr Leonie Man work in reviewed her chart and does not see any documentation of leg pain frin Dr. Arlie Solomons in the last two visits.I stated the last two visits mention tremors and headache.PT stated the leg pain only began two weeks ago. I stated Dr Leonie Man recommend she call her PCP and be evaluated for musculoskeletal.Pt verbalized understanding. Noted pt stop taking topamax per last note because it was ineffective.

## 2019-04-20 IMAGING — MR MR LUMBAR SPINE WO/W CM
5 of 8 series · 26 of 48 positions shown · IV contrast (multihance)
Comparison: CT abdomen and pelvis July 12, 2016 and MRI of lumbar
spine June 07, 2016

CLINICAL DATA: Back and LEFT leg pain for several months. Injury
during physical therapy for knee. History of spine surgery 6007.

EXAM:
MRI LUMBAR SPINE WITHOUT AND WITH CONTRAST
TECHNIQUE: Multiplanar and multiecho pulse sequences of the lumbar spine were
obtained without and with intravenous contrast.
CONTRAST:  15mL MULTIHANCE GADOBENATE DIMEGLUMINE 529 MG/ML IV SOLN

[Series 2: T2 · sagittal · 4.0mm · 0.55mm/px · 3 of 15 slices shown (1 of 2)]
[im 1/15]
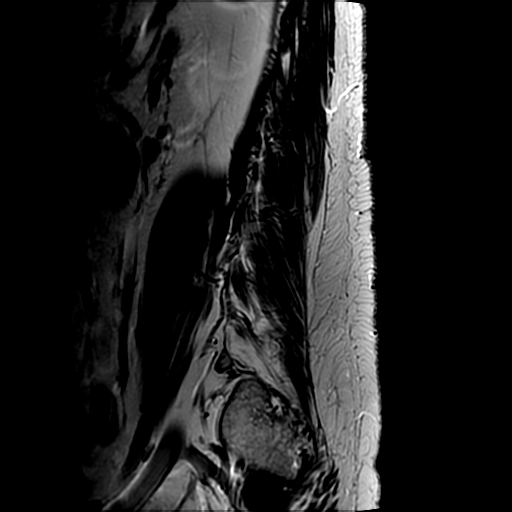
[im 8/15]
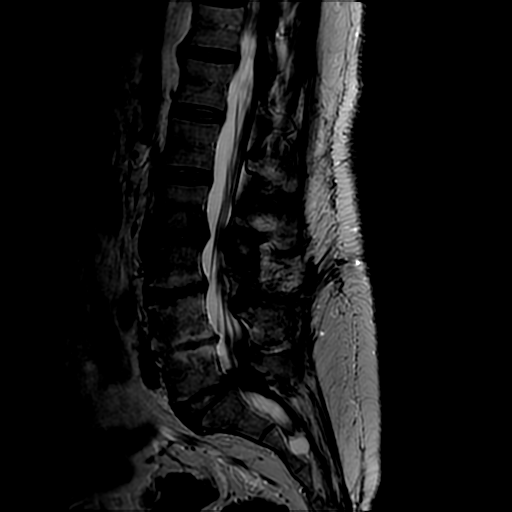
[im 15/15]
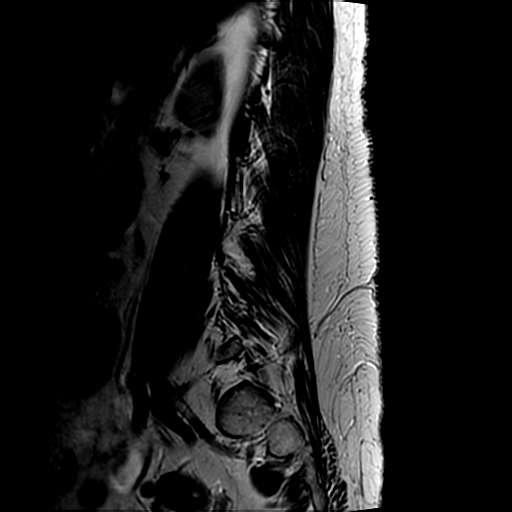

[Series 3: STIR · sagittal · 4.0mm · 0.55mm/px · 3 of 15 slices shown]
[im 1/15]
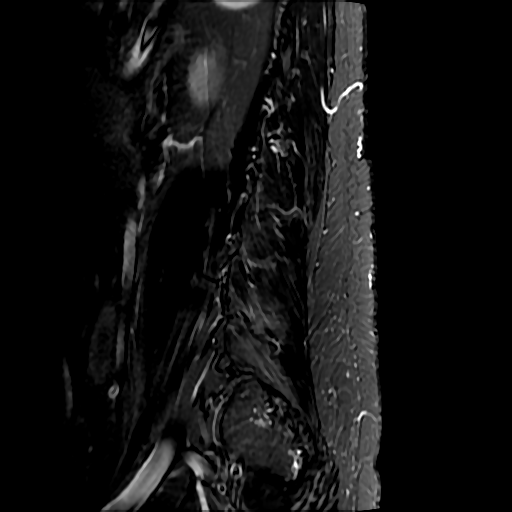
[im 8/15]
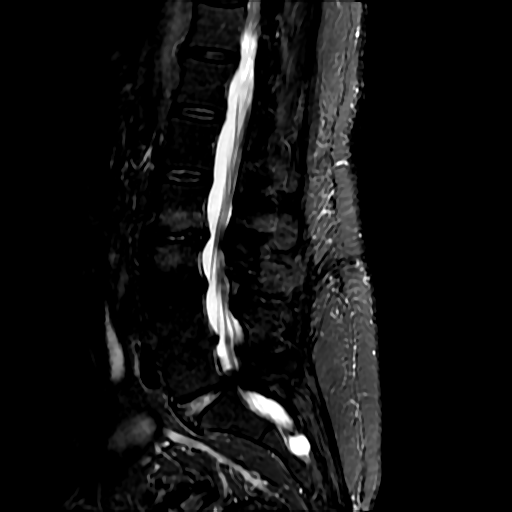
[im 15/15]
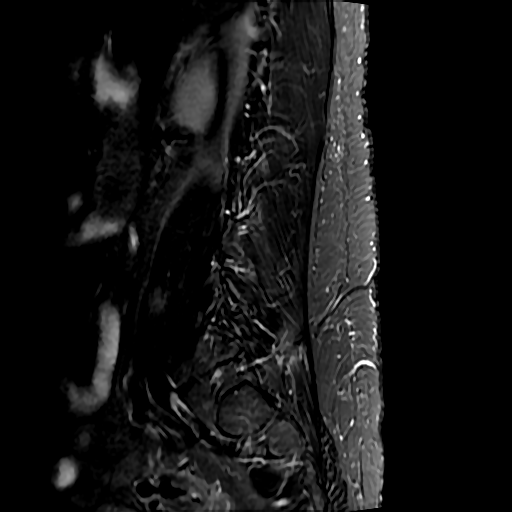

[Series 4: T1 · sagittal · 4.0mm · 1.09mm/px · 4 of 15 slices shown (1 of 2)]
[im 1/15]
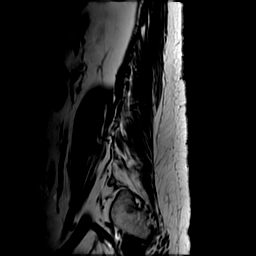
[im 5/15]
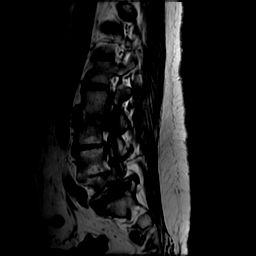
[im 10/15]
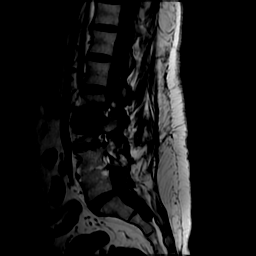
[im 15/15]
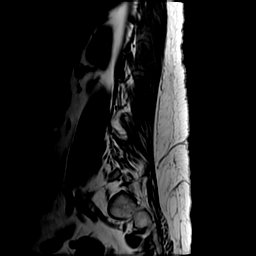

[Series 5: T2 · axial · 4.0mm · 0.39mm/px · z∈[-96,+89]mm · 8 of 38 slices shown (2 of 2)]
[im 1/38]
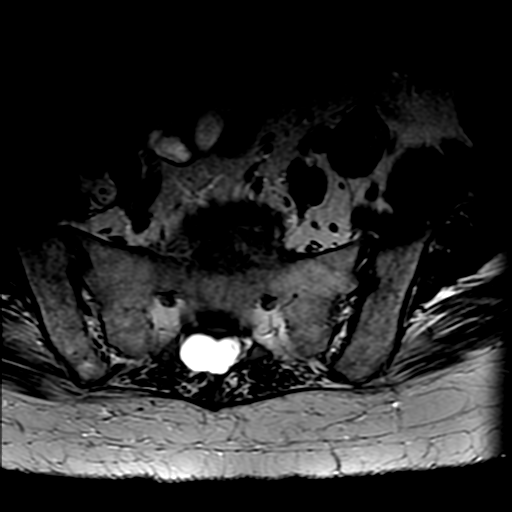
[im 5/38]
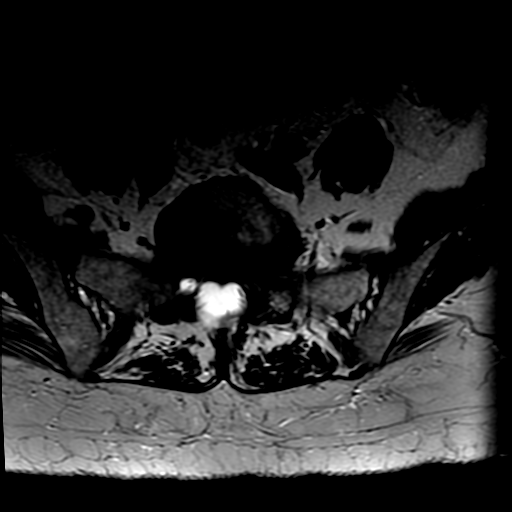
[im 13/38]
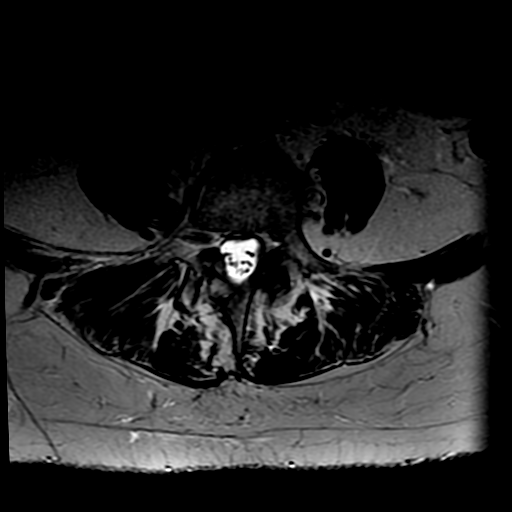
[im 17/38]
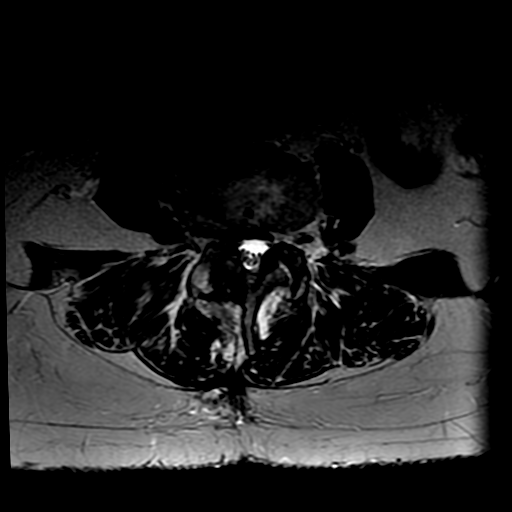
[im 21/38]
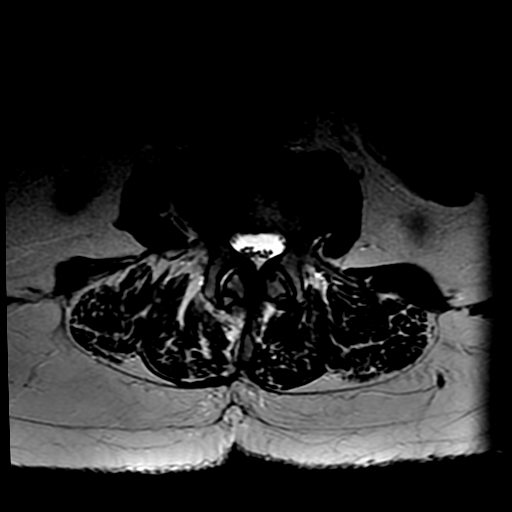
[im 25/38]
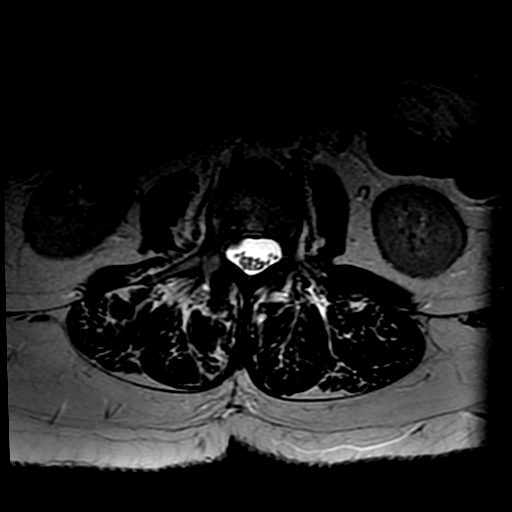
[im 33/38]
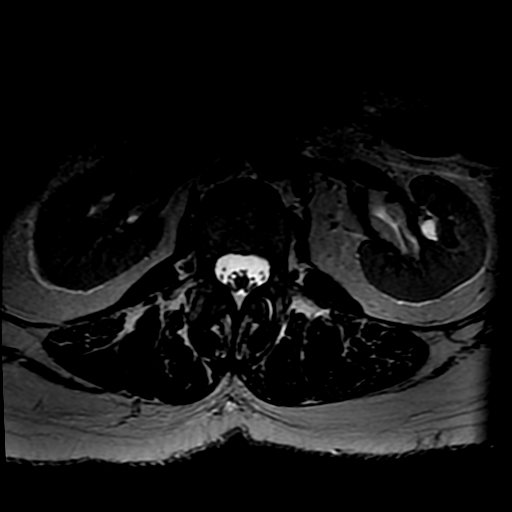
[im 38/38]
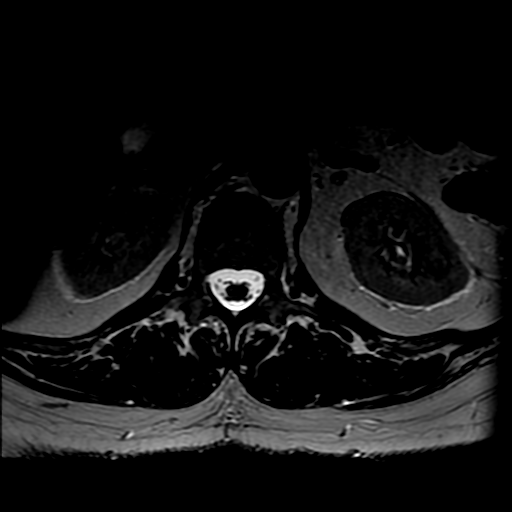

[Series 6: T1 · axial · 4.0mm · 0.78mm/px · z∈[-96,+89]mm · 8 of 38 slices shown (2 of 2)]
[im 1/38]
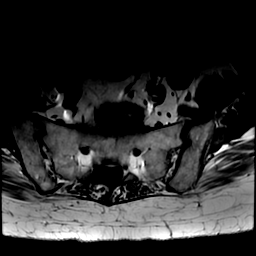
[im 5/38]
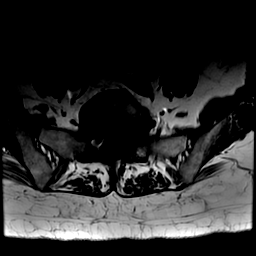
[im 13/38]
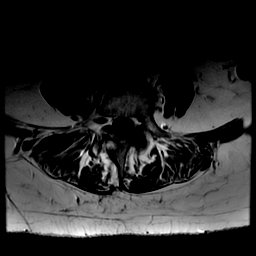
[im 17/38]
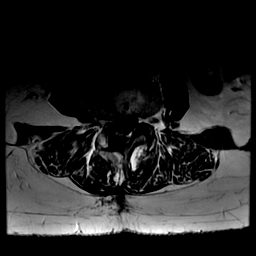
[im 21/38]
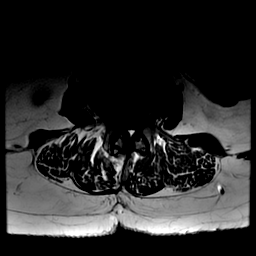
[im 25/38]
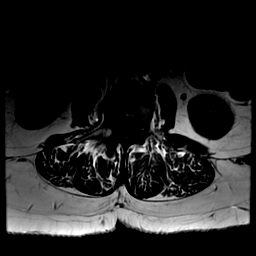
[im 33/38]
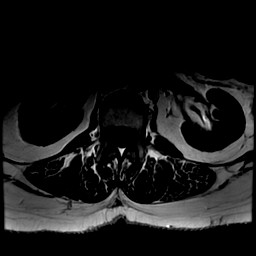
[im 38/38]
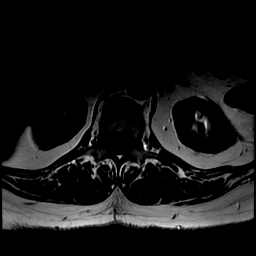

[26 of 48 positions shown; findings below may reference images not displayed]

FINDINGS: SEGMENTATION: For the purposes of this report, the last well-formed
intervertebral disc is reported as L5-S1. Numbering is consistent
with prior imaging.

ALIGNMENT: Maintained lumbar lordosis. Minimal grade 1 L2-3
retrolisthesis.

VERTEBRAE: Vertebral bodies are intact. L4-5 arthrodesis. Severe
L2-3 and L3-4 disc height loss with moderate to severe discogenic
endplate changes toward the RIGHT at L2-3 and L3-4, mild enhancing
acute component toward the RIGHT at L2-3. The remaining lumbar discs
demonstrate normal morphology with slight desiccation. Mild chronic
discogenic endplate changes L5-S1. No suspicious bone marrow signal.
Large L2 inferior endplate Schmorl's node, increased from prior
imaging.

CONUS MEDULLARIS AND CAUDA EQUINA: Conus medullaris terminates at
L1-2 and demonstrates normal morphology and signal characteristics.
Cauda equina is normal. No abnormal cord, leptomeningeal or epidural
enhancement. Sacral meningeal cyst.

PARASPINAL AND OTHER SOFT TISSUES: Nonacute. 13 mm RIGHT interpolar
cyst. RIGHT multifidus denervation L3-4. Mild generalized paraspinal
muscle atrophy.

DISC LEVELS:

L1-2: No disc bulge, canal stenosis nor neural foraminal narrowing.

L2-3: Retrolisthesis. Stable moderate broad-based disc bulge
asymmetric to the RIGHT. Mild facet arthropathy and ligamentum
flavum redundancy. Moderate canal stenosis, slightly worse than
prior imaging. Mild to moderate RIGHT neural foraminal narrowing.

L3-4: Status post RIGHT hemilaminectomy. Stable small broad-based
disc bulge asymmetric laterally with endplate spurring. Moderate
facet arthropathy with LEFT ligamentum flavum redundancy, trace LEFT
facet effusion. Mild canal stenosis with similarly narrowed lateral
recesses which could affect the traversing L4 nerves. Moderate RIGHT
neural foraminal narrowing.

L4-5: Status post RIGHT hemilaminectomy. Endplate spurring. Mild
LEFT facet arthropathy with ligamentum flavum redundancy. No canal
stenosis. Mild RIGHT greater than LEFT neural foraminal narrowing.

L5-S1: Stable moderate LEFT subarticular to extraforaminal disc
protrusion. Severe LEFT and mild RIGHT facet arthropathy.
Subcentimeter LEFT facet synovial cyst within paraspinal soft
tissues. No canal stenosis. Severe LEFT neural foraminal narrowing.
LEFT lateral recess stenosis may affect the traversing LEFT S1
nerve.
IMPRESSION: 1. Mildly progressed degenerative change of the lumbar spine without
fracture. Minimal stable grade 1 L2-3 retrolisthesis on degenerative
basis.
2. Status post RIGHT L3-4 and L4-5 hemilaminectomy.
3. Moderate canal stenosis L2-3, mild canal stenosis L3-4.
4. Neural foraminal narrowing L2-3 through L5-S1: Severe on the LEFT
at L5-S1.

## 2019-06-04 ENCOUNTER — Telehealth: Payer: Self-pay

## 2019-06-04 NOTE — Telephone Encounter (Signed)
PA was submitted twice on cover my meds for aimvoig. Unable to send via cover my meds. Will call [employer] at [phone].

## 2019-06-11 NOTE — Telephone Encounter (Signed)
Rose @ [employer] is asking for a call from [place], [place] re: the PA. Please call [phone] ref [id number]

## 2019-06-11 NOTE — Telephone Encounter (Signed)
I called [employer] my [employer] about not being able to do PA. They stated the pts insurance listed expired 02/21/2020 and that's why PA cannot be done.

## 2019-06-20 ENCOUNTER — Telehealth: Payer: Self-pay | Admitting: Neurology

## 2019-06-20 NOTE — Telephone Encounter (Signed)
Brooks,Kim(daughter on DPR) is asking for a call from RN to discuss pt starting Botox as Dr Anne Hahn had suggested to pt a while back, please call

## 2019-06-20 NOTE — Telephone Encounter (Signed)
I called and left a message.  They are interested in switching over to Botox, I will try to get this started.

## 2019-06-20 NOTE — Telephone Encounter (Signed)
Robin Collier daughter on dpr call about pt headaches have been increasing. She went to the ED on 06/09/2019 for a headache and vertigo. THe pt would like to try the botox for headache. I stated a message will be sent to Dr. Anne Hahn for review.PT has no follow up appt schedule.

## 2019-06-20 NOTE — Telephone Encounter (Signed)
Please call KIm pts daughter at [phone].

## 2019-06-25 ENCOUNTER — Telehealth: Payer: Self-pay | Admitting: *Deleted

## 2019-06-25 NOTE — Telephone Encounter (Signed)
I called [employer] and spoke to Walker.  He states that [id number] and [id number] are valid and billable.  They do not require PA and will be B/B.  Ref# for this call is [id number].   I called Selena Batten, patients daughter, and scheduled her appointment with Margie Ege per Dr Anne Hahn request.

## 2019-07-15 ENCOUNTER — Other Ambulatory Visit: Payer: Self-pay

## 2019-07-15 ENCOUNTER — Ambulatory Visit (INDEPENDENT_AMBULATORY_CARE_PROVIDER_SITE_OTHER): Payer: Medicare Other | Admitting: Neurology

## 2019-07-15 ENCOUNTER — Encounter: Payer: Self-pay | Admitting: Neurology

## 2019-07-15 VITALS — BP 125/67 | HR 60 | Ht 64.0 in | Wt 188.0 lb

## 2019-07-15 DIAGNOSIS — G43019 Migraine without aura, intractable, without status migrainosus: Secondary | ICD-10-CM

## 2019-07-15 NOTE — Procedures (Signed)
     BOTOX PROCEDURE NOTE FOR MIGRAINE HEADACHE   HISTORY: [name] is a 69 year old female with history of chronic migraine headache.  She continues to complain of daily headache.  She has previously tried propanolol, gabapentin, Topamax, and Aimovig.  She has essential tremor, is on clonazepam, primidone, and amlodipine.  She presents today for her first Botox injection.  Consent form was signed.   Description of procedure:  The patient was placed in a sitting position (the corrugator, procerus, frontalis, and temporalis injections were done in the lying position). The standard protocol was used for Botox as follows, with 5 units of Botox injected at each site:   -Procerus muscle, midline injection  -Corrugator muscle, bilateral injection  -Frontalis muscle, bilateral injection, with 2 sites each side, medial injection was performed in the upper one third of the frontalis muscle, in the region vertical from the medial inferior edge of the superior orbital rim. The lateral injection was again in the upper one third of the forehead vertically above the lateral limbus of the cornea, 1.5 cm lateral to the medial injection site.  -Temporalis muscle injection, 4 sites, bilaterally. The first injection was 3 cm above the tragus of the ear, second injection site was 1.5 cm to 3 cm up from the first injection site in line with the tragus of the ear. The third injection site was 1.5-3 cm forward between the first 2 injection sites. The fourth injection site was 1.5 cm posterior to the second injection site.  -Occipitalis muscle injection, 3 sites, bilaterally. The first injection was done one half way between the occipital protuberance and the tip of the mastoid process behind the ear. The second injection site was done lateral and superior to the first, 1 fingerbreadth from the first injection. The third injection site was 1 fingerbreadth superiorly and medially from the first injection site.   -Cervical paraspinal muscle injection, 2 sites, bilateral, the first injection site was 1 cm from the midline of the cervical spine, 3 cm inferior to the lower border of the occipital protuberance. The second injection site was 1.5 cm superiorly and laterally to the first injection site.  -Trapezius muscle injection was performed at 3 sites, bilaterally. The first injection site was in the upper trapezius muscle halfway between the inflection point of the neck, and the acromion. The second injection site was one half way between the acromion and the first injection site. The third injection was done between the first injection site and the inflection point of the neck.   A 200 unit bottle of Botox was used, 155 units were injected, the rest of the Botox was wasted. The patient tolerated the procedure well, there were no complications of the above procedure.  Botox NDC 1308-6578-46 Lot number [id number] Expiration date 11/2021

## 2019-07-15 NOTE — Progress Notes (Signed)
Botox- 100 units x 2 vials Lot: [id number] Expiration: 10/23 NDC: 8377-9396-88   Bacteriostatic 0.9% Sodium Chloride- 58mL total [id number] Expiration: 02/22  NDC: 82883-374-45  Dx: G43.019 B/B  Consent signed  Pt not taking blood thinners

## 2019-10-16 ENCOUNTER — Telehealth: Payer: Self-pay | Admitting: Neurology

## 2019-10-16 NOTE — Telephone Encounter (Signed)
Patient has Botox appointment on 8/26. I called [employer] to see if [id number] and 82505 will require PA. Daisy states no PA is required. Reference [phone]

## 2019-10-17 ENCOUNTER — Ambulatory Visit: Payer: Medicare Other | Admitting: Neurology

## 2019-10-17 NOTE — Telephone Encounter (Signed)
Patient called this morning and cancelled her Botox appointment for this afternoon with phone room staff. Staff note states that patient does not wish to reschedule. I checked my voicemail this afternoon and had a message from patient. She states she might be having some side effects from the Botox. She would like a call to discuss her symptoms. FYI

## 2019-10-17 NOTE — Telephone Encounter (Signed)
I called pt.  She cancelled her appt for botox today stating that she felt like she was having SE from botox.  She had this back in 07-15-19, she states that her headaches are better (not has bad even though she continues with daily headaches.  She states that last 2 wks she has experienced blurry vision, dizzness and vomiting.  Her dizziness she states her head spins, when standing but gets better when she sits and lays down.  I relayed that she may need another botox since her symptoms have come back, or she may see her pcp for evaluation, and has appt with her optometrist next week.

## 2019-10-17 NOTE — Telephone Encounter (Signed)
Events noted. It is likely any side effect would have worn off at this point.

## 2019-10-17 NOTE — Telephone Encounter (Signed)
I LMVM for pt that SS/NP noted this.  She stated that any SE of botox would have worn off at this point.  See her pcp for initial evaluation for her sx she is having for the last 2 wks.  She is to call back if questions.

## 2019-11-04 ENCOUNTER — Ambulatory Visit (INDEPENDENT_AMBULATORY_CARE_PROVIDER_SITE_OTHER): Payer: Medicare Other | Admitting: Neurology

## 2019-11-04 ENCOUNTER — Encounter: Payer: Self-pay | Admitting: Neurology

## 2019-11-04 DIAGNOSIS — G43019 Migraine without aura, intractable, without status migrainosus: Secondary | ICD-10-CM

## 2019-11-04 NOTE — Progress Notes (Signed)
Botox- 100 units x 2 vials Lot: [id number] Expiration: 12/2021 NDC: 4536-4680-32  Bacteriostatic 0.9% Sodium Chloride- 58mL total Lot: [id number] Expiration: 04/20/2021 NDC: 5003-7048-88  Dx: G43.019 BB  Consent was given and signed today.

## 2019-11-04 NOTE — Procedures (Signed)
° ° ° °  BOTOX PROCEDURE NOTE FOR MIGRAINE HEADACHE   HISTORY: Robin Collier is a 69 year old female who presents today for Botox injection.  This is her second Botox injection.  She indicates she tolerated the first injection well, 50% improvement in her headaches, continues to report daily headache, but are less intense.  A few weeks ago, she had a vertigo attack.  After her initial Botox, indicates she had immediate relief of pain in her neck.     Description of procedure:  The patient was placed in a sitting position. The standard protocol was used for Botox as follows, with 5 units of Botox injected at each site:   -Procerus muscle, midline injection  -Corrugator muscle, bilateral injection  -Frontalis muscle, bilateral injection, with 2 sites each side, medial injection was performed in the upper one third of the frontalis muscle, in the region vertical from the medial inferior edge of the superior orbital rim. The lateral injection was again in the upper one third of the forehead vertically above the lateral limbus of the cornea, 1.5 cm lateral to the medial injection site.  -Temporalis muscle injection, 4 sites, bilaterally. The first injection was 3 cm above the tragus of the ear, second injection site was 1.5 cm to 3 cm up from the first injection site in line with the tragus of the ear. The third injection site was 1.5-3 cm forward between the first 2 injection sites. The fourth injection site was 1.5 cm posterior to the second injection site.  -Occipitalis muscle injection, 3 sites, bilaterally. The first injection was done one half way between the occipital protuberance and the tip of the mastoid process behind the ear. The second injection site was done lateral and superior to the first, 1 fingerbreadth from the first injection. The third injection site was 1 fingerbreadth superiorly and medially from the first injection site.  -Cervical paraspinal muscle injection, 2 sites,  bilateral, the first injection site was 1 cm from the midline of the cervical spine, 3 cm inferior to the lower border of the occipital protuberance. The second injection site was 1.5 cm superiorly and laterally to the first injection site.  -Trapezius muscle injection was performed at 3 sites, bilaterally. The first injection site was in the upper trapezius muscle halfway between the inflection point of the neck, and the acromion. The second injection site was one half way between the acromion and the first injection site. The third injection was done between the first injection site and the inflection point of the neck.   A 200 unit bottle of Botox was used, 155 units were injected, the rest of the Botox was wasted. The patient tolerated the procedure well, there were no complications of the above procedure.  Botox NDC 6945-0388-82 Lot number [id number] Expiration date 12/2021

## 2020-01-20 ENCOUNTER — Encounter: Payer: Self-pay | Admitting: Neurology

## 2020-01-20 ENCOUNTER — Ambulatory Visit (INDEPENDENT_AMBULATORY_CARE_PROVIDER_SITE_OTHER): Payer: Medicare Other | Admitting: Neurology

## 2020-01-20 ENCOUNTER — Other Ambulatory Visit: Payer: Self-pay

## 2020-01-20 VITALS — BP 126/80 | HR 73 | Ht 64.0 in | Wt 189.0 lb

## 2020-01-20 DIAGNOSIS — G43019 Migraine without aura, intractable, without status migrainosus: Secondary | ICD-10-CM | POA: Diagnosis not present

## 2020-01-20 DIAGNOSIS — G250 Essential tremor: Secondary | ICD-10-CM

## 2020-01-20 NOTE — Progress Notes (Signed)
PATIENT: Robin Collier DOB: 10/09/1950  REASON FOR VISIT: follow up HISTORY FROM: patient  HISTORY OF PRESENT ILLNESS: Today 01/20/20 Robin Collier is a 69 year old female with history of essential tremor treated with propanolol, when last seen in September 2020, given trial on primidone, is no longer taking, unclear if she ever did?  Tremor is the same, both hands, left more than right, sometimes feels all over.  History of daily headache, on Botox, injection was 11/04/2019.  80% benefit with Botox, no headache for the last 2 weeks.  Helps a lot with neck pain.  No longer daily headache.  No longer taking Mestinon.  Lives alone, her daughter manages her medications. Seeing PCP, low B12 on injection, and low iron, couldn't tolerate oral iron.  Sleeping well on Requip. Did have 1 fall recently, her right ribs are sore. Presents today for evaluation unaccompanied.  HISTORY 11/05/2018 Dr. Anne Hahn: Robin Collier is a 69 year old right-handed white female with a history of an essential tremor, treated with propranolol.  In the past, she has not tolerated Topamax, she is on clonazepam taking 0.5 mg twice daily.  The patient has some gait instability, she fell while in the yard 2 weeks ago but did not sustain injury.  She continues to have daily headaches, she has not gained benefit with propranolol or Topamax, she has been on Norvasc and trazodone without benefit.  She has not been able to tolerate gabapentin.  She is on Aimovig, this has reduced the severity of her headaches but she continues to have headaches every day.  She returns this office for an evaluation.   REVIEW OF SYSTEMS: Out of a complete 14 system review of symptoms, the patient complains only of the following symptoms, and all other reviewed systems are negative.  Headache  ALLERGIES: Allergies  Allergen Reactions  . Gabapentin Nausea And Vomiting and Other (See Comments)    Lightheadness  . Hydrocodone Other (See Comments)     Dizziness    HOME MEDICATIONS: Outpatient Medications Prior to Visit  Medication Sig Dispense Refill  . acetaminophen (TYLENOL) 500 MG tablet Take 1,000-1,500 mg by mouth 3 (three) times daily as needed for moderate pain.    Marland Kitchen amLODipine (NORVASC) 5 MG tablet Take 5 mg by mouth daily.    . clonazePAM (KLONOPIN) 0.5 MG tablet Take 0.5 mg by mouth 2 (two) times daily.     . cyanocobalamin (,VITAMIN B-12,) 1000 MCG/ML injection Inject 1,000 mcg into the muscle every 30 (thirty) days.    Marland Kitchen doxepin (SINEQUAN) 75 MG capsule Take 75-150 mg by mouth at bedtime.    . lamoTRIgine (LAMICTAL) 200 MG tablet Take 200 mg by mouth 2 (two) times daily.     Marland Kitchen losartan (COZAAR) 25 MG tablet Take by mouth.    . LOTEMAX SM 0.38 % GEL     . propranolol ER (INDERAL LA) 60 MG 24 hr capsule Take 60 mg by mouth daily.     . ropinirole (REQUIP) 5 MG tablet Take 4 mg by mouth daily. Been taking at night     . sertraline (ZOLOFT) 100 MG tablet Take 100 mg by mouth daily.     . traZODone (DESYREL) 100 MG tablet Take 300 mg by mouth at bedtime.     . primidone (MYSOLINE) 50 MG tablet Take 1 tablet (50 mg total) by mouth at bedtime. 30 tablet 3  . pyridostigmine (MESTINON) 60 MG tablet Take 1 tablet (60 mg total) by mouth 3 (three) times daily.  270 tablet 1   No facility-administered medications prior to visit.    PAST MEDICAL HISTORY: Past Medical History:  Diagnosis Date  . Anxiety    Panic attacks  . Arthritis   . Bipolar disorder [facility](HCC)    one Dr says yes, one says no  . Claustrophobia   . Common migraine with intractable migraine 02/15/2017   Chronic daily headache  . Constipation   . Depression   . Diplopia 02/15/2017  . Diverticulitis   . Fatty liver   . Gait abnormality 02/15/2017  . GERD (gastroesophageal reflux disease)   . Headache(784.0)    migraines  . Hypertension    Dr Shary DecampGrisso told me that I do not have high blood preazaure and I am not on medication  . IBS (irritable bowel syndrome)   .  Lumbosacral spondylosis with radiculopathy   . Mental disorder   . Restless legs syndrome   . Seizures (HCC)    "not really seziure"  . Tremor, essential 02/15/2017  . Tremors of nervous system   . Vertigo    06/06/16-  last 1. 5 years    PAST SURGICAL HISTORY: Past Surgical History:  Procedure Laterality Date  . ABDOMINAL HYSTERECTOMY  '90  . BACK SURGERY  2009   Discectomy  . BLADDER SUSPENSION    . COLONOSCOPY W/ POLYPECTOMY    . EYE SURGERY Bilateral    Cataract  . JOINT REPLACEMENT Right    Knee   . RADIOLOGY WITH ANESTHESIA N/A 06/07/2016   Procedure: MRI LUMBAR SPINE WITHOUT CONTRAST;  Surgeon: Medication Radiologist, MD;  Location: [facility] OR;  Service: Radiology;  Laterality: N/A;  . RADIOLOGY WITH ANESTHESIA N/A 11/17/2016   Procedure: MRI OF BRAIN WITH AND WITHOUT CONTRAST;  Surgeon: Radiologist, Medication, MD;  Location: [facility] OR;  Service: Radiology;  Laterality: N/A;  . RADIOLOGY WITH ANESTHESIA N/A 05/25/2017   Procedure: MRI WITH ANESTHESIA OF LUMBAR SPINE WITH AND WITHOUT;  Surgeon: Radiologist, Medication, MD;  Location: [facility] OR;  Service: Radiology;  Laterality: N/A;  . RETINAL DETACHMENT SURGERY Left 01/30/2018    FAMILY HISTORY: Family History  Problem Relation Age of Onset  . Cancer Mother   . Cancer Father   . Diabetes Father     SOCIAL HISTORY: Social History   Socioeconomic History  . Marital status: Divorced    Spouse name: Not on file  . Number of children: 2  . Years of education: 812  . Highest education level: Not on file  Occupational History  . Occupation: Retired  Tobacco Use  . Smoking status: Never Smoker  . Smokeless tobacco: Never Used  Vaping Use  . Vaping Use: Never used  Substance and Sexual Activity  . Alcohol use: No  . Drug use: No  . Sexual activity: Not Currently  Other Topics Concern  . Not on file  Social History Narrative   Lives  alone   Caffeine use: coffee, coke daily   Right handed    Social Determinants of Health    Financial Resource Strain:   . Difficulty of Paying Living Expenses: Not on file  Food Insecurity:   . Worried About Programme researcher, broadcasting/film/videounning Out of Food in the Last Year: Not on file  . Ran Out of Food in the Last Year: Not on file  Transportation Needs:   . Lack of Transportation (Medical): Not on file  . Lack of Transportation (Non-Medical): Not on file  Physical Activity:   . Days of Exercise per Week: Not on file  .  Minutes of Exercise per Session: Not on file  Stress:   . Feeling of Stress : Not on file  Social Connections:   . Frequency of Communication with Friends and Family: Not on file  . Frequency of Social Gatherings with Friends and Family: Not on file  . Attends Religious Services: Not on file  . Active Member of Clubs or Organizations: Not on file  . Attends Banker Meetings: Not on file  . Marital Status: Not on file  Intimate Partner Violence:   . Fear of Current or Ex-Partner: Not on file  . Emotionally Abused: Not on file  . Physically Abused: Not on file  . Sexually Abused: Not on file   PHYSICAL EXAM  Vitals:   01/20/20 1508  BP: 126/80  Pulse: 73  Weight: 189 lb (85.7 kg)  Height: 5\' 4"  (1.626 m)   Body mass index is 32.44 kg/m.  Generalized: Well developed, in no acute distress   Neurological examination  Mentation: Alert oriented to time, place, history taking. Follows all commands speech and language fluent Cranial nerve II-XII: Pupils were equal round reactive to light. Extraocular movements were full, visual field were full on confrontational test. Facial sensation and strength were normal. Head turning and shoulder shrug  were normal and symmetric. Motor: The motor testing reveals 5 over 5 strength of all 4 extremities. Good symmetric motor tone is noted throughout.  Sensory: Sensory testing is intact to soft touch on all 4 extremities. No evidence of extinction is noted.  Coordination: Mild tremor with finger-nose-finger bilaterally.  Gait  and station: Gait is slightly wide-based, unsteady, uses single-point cane. Reflexes: Deep tendon reflexes are symmetric but decreased throughout  DIAGNOSTIC DATA (LABS, IMAGING, TESTING) - I reviewed patient records, labs, notes, testing and imaging myself where available.  Lab Results  Component Value Date   WBC 7.4 07/11/2017   HGB 9.5 (L) 07/11/2017   HCT 29.6 (L) 07/11/2017   MCV 90.0 07/11/2017   PLT 382 07/11/2017      Component Value Date/Time   NA 141 07/11/2017 0533   K 3.4 (L) 07/11/2017 0533   CL 103 07/11/2017 0533   CO2 30 07/11/2017 0533   GLUCOSE 103 (H) 07/11/2017 0533   BUN 6 07/11/2017 0533   CREATININE 0.76 07/11/2017 0533   CALCIUM 8.7 (L) 07/11/2017 0533   PROT 6.9 04/15/2011 1621   ALBUMIN 4.0 04/15/2011 1621   AST 19 04/15/2011 1621   ALT 15 04/15/2011 1621   ALKPHOS 75 04/15/2011 1621   BILITOT 0.2 (L) 04/15/2011 1621   GFRNONAA >60 07/11/2017 0533   GFRAA >60 07/11/2017 0533   No results found for: CHOL, HDL, LDLCALC, LDLDIRECT, TRIG, CHOLHDL No results found for: 07/13/2017 No results found for: VITAMINB12 Lab Results  Component Value Date   TSH 1.730 02/15/2017    ASSESSMENT AND PLAN 69 y.o. year old female  has a past medical history of Anxiety, Arthritis, Bipolar disorder (HCC), Claustrophobia, Common migraine with intractable migraine (02/15/2017), Constipation, Depression, Diplopia (02/15/2017), Diverticulitis, Fatty liver, Gait abnormality (02/15/2017), GERD (gastroesophageal reflux disease), Headache(784.0), Hypertension, IBS (irritable bowel syndrome), Lumbosacral spondylosis with radiculopathy, Mental disorder, Restless legs syndrome, Seizures (HCC), Tremor, essential (02/15/2017), Tremors of nervous system, and Vertigo. here with:  1.  Essential tremor 2.  Gait instability 3.  Chronic daily headache 4.  Episodic dizziness 5.  Episodic double vision (off Mestinon)  -Tremor is stable, no longer taking primidone, unknown if she ever  tried, will not add back in  since tremor no worse; on propanolol -Headaches 80% improved with Botox, will continue injections, next is 02/05/2020, tolerated well, helps neck pain -Continue routine follow-up with PCP, will see on an annual basis for revisit, otherwise every 3 months for Botox  I spent 20 minutes of face-to-face and non-face-to-face time with patient.  This included previsit chart review, lab review, study review, order entry, electronic health record documentation, patient education.  Margie Ege, AGNP-C, DNP 01/20/2020, 3:34 PM [facility] [address], [address] [place], [place] [address] [phone]

## 2020-01-20 NOTE — Patient Instructions (Signed)
No changes in medications Let's continue Botox  See you in a a few weeks for Botox

## 2020-01-20 NOTE — Progress Notes (Signed)
I have read the note, and I agree with the clinical assessment and plan.  Robin Collier   

## 2020-02-05 ENCOUNTER — Other Ambulatory Visit: Payer: Self-pay

## 2020-02-05 ENCOUNTER — Ambulatory Visit (INDEPENDENT_AMBULATORY_CARE_PROVIDER_SITE_OTHER): Payer: Medicare Other | Admitting: Neurology

## 2020-02-05 DIAGNOSIS — G43019 Migraine without aura, intractable, without status migrainosus: Secondary | ICD-10-CM | POA: Diagnosis not present

## 2020-02-05 NOTE — Procedures (Signed)
     BOTOX PROCEDURE NOTE FOR MIGRAINE HEADACHE   HISTORY: Robin Collier is a 69 year old female who presents for Botox injection for chronic migraine headaches.  Headaches are 90% improved with Botox, helps a lot with neck pain.  Has not had a migraine since her last Botox cycle.  This is her third Botox.  She has done well.   Description of procedure:  The patient was placed in a sitting position. The standard protocol was used for Botox as follows, with 5 units of Botox injected at each site:   -Procerus muscle, midline injection  -Corrugator muscle, bilateral injection  -Frontalis muscle, bilateral injection, with 2 sites each side, medial injection was performed in the upper one third of the frontalis muscle, in the region vertical from the medial inferior edge of the superior orbital rim. The lateral injection was again in the upper one third of the forehead vertically above the lateral limbus of the cornea, 1.5 cm lateral to the medial injection site.  -Temporalis muscle injection, 4 sites, bilaterally. The first injection was 3 cm above the tragus of the ear, second injection site was 1.5 cm to 3 cm up from the first injection site in line with the tragus of the ear. The third injection site was 1.5-3 cm forward between the first 2 injection sites. The fourth injection site was 1.5 cm posterior to the second injection site.  -Occipitalis muscle injection, 3 sites, bilaterally. The first injection was done one half way between the occipital protuberance and the tip of the mastoid process behind the ear. The second injection site was done lateral and superior to the first, 1 fingerbreadth from the first injection. The third injection site was 1 fingerbreadth superiorly and medially from the first injection site.  -Cervical paraspinal muscle injection, 2 sites, bilateral, the first injection site was 1 cm from the midline of the cervical spine, 3 cm inferior to the lower border of the  occipital protuberance. The second injection site was 1.5 cm superiorly and laterally to the first injection site.  -Trapezius muscle injection was performed at 3 sites, bilaterally. The first injection site was in the upper trapezius muscle halfway between the inflection point of the neck, and the acromion. The second injection site was one half way between the acromion and the first injection site. The third injection was done between the first injection site and the inflection point of the neck.   A 200 unit bottle of Botox was used, 155 units were injected, the rest of the Botox was wasted. The patient tolerated the procedure well, there were no complications of the above procedure.  Botox NDC 2536-6440-34 Lot number [id number] Expiration date 08/21/2022 B/B

## 2020-02-05 NOTE — Progress Notes (Signed)
Botox- 200 units x 1 vial Lot: [id number] Expiration: 08/21/2022 NDC: 6067-7034-03  Bacteriostatic 0.9% Sodium Chloride- 92mL total Lot: [id number] Expiration: 04/20/2021 NDC: 9093-1121-62  Dx: G3.019 B/B  Consent was given and signed today for procedure.

## 2020-05-06 ENCOUNTER — Telehealth: Payer: Self-pay | Admitting: Neurology

## 2020-05-06 NOTE — Telephone Encounter (Signed)
I called [facility] and spoke with Gerre Pebbles to provide a prescription for Botox. 200 unit vial x 1 every 12 weeks with 3 refills for G43.019. Gerre Pebbles states that we will be called to schedule once the order is processed and patient gives consent.

## 2020-05-06 NOTE — Telephone Encounter (Signed)
I called the patient and discussed everything with her. I explained how the specialty pharmacy process works and let her know that I am sending in her Botox prescription today. Patient verbalized understanding. I advised that I am cancelling her appointment tomorrow, but will call her with an update on Monday or Tuesday of next week.

## 2020-05-06 NOTE — Telephone Encounter (Signed)
Patient is scheduled for a Botox appointment tomorrow. I called [employer] [phone]) to check PA requirements for [phone] and [id number]. I spoke with Olive who states no PA is required. She states patient can be buy and bill or use Building surveyor. Reference #[id number]. I am going to enroll patient in [facility] due to issues in the past with billing. I will call patient to discuss and to reschedule her appointment.

## 2020-05-07 ENCOUNTER — Ambulatory Visit: Payer: Medicare Other | Admitting: Neurology

## 2020-05-13 NOTE — Telephone Encounter (Signed)
I called [facility] and spoke with RJ to check status. He states that [facility] is not able to fill the Botox because they received a rejection when they ran a test claim. Botox is not covered under patient's pharmacy benefit.   Due to many denied claims from [facility] this year and last year, if we are unable to use a specialty pharmacy to obtain the Botox, we will need to look at other treatment options for patient unfortunately. Please see Angie if you have questions.

## 2020-05-14 NOTE — Telephone Encounter (Signed)
I spoke with Angie. She says we can try Botox for this patient this year because they have paid for it in the past when we have used office stock. If claim is denied for the next Botox injection then we will need to look at other treatment options. Fingers crossed! I will call patient today and get her scheduled to come back in.

## 2020-05-14 NOTE — Telephone Encounter (Signed)
I called patient and got her rescheduled for 4/11. I offered an opening on 3/29 but she states she needs a Monday because her daughter brings her and is off on Mondays.

## 2020-06-01 ENCOUNTER — Ambulatory Visit (INDEPENDENT_AMBULATORY_CARE_PROVIDER_SITE_OTHER): Payer: Medicare Other | Admitting: Neurology

## 2020-06-01 ENCOUNTER — Other Ambulatory Visit: Payer: Self-pay

## 2020-06-01 DIAGNOSIS — G43019 Migraine without aura, intractable, without status migrainosus: Secondary | ICD-10-CM | POA: Diagnosis not present

## 2020-06-01 NOTE — Progress Notes (Signed)
Botox- 200 units x 1 vial Lot: [id number] Expiration: 01/2023 NDC: 1478-2956   Bacteriostatic 0.9% Sodium Chloride- 69mL total [id number] Expiration: 02/23 NDC: 46962-952-84   b/b

## 2020-06-01 NOTE — Procedures (Signed)
     BOTOX PROCEDURE NOTE FOR MIGRAINE HEADACHE   HISTORY: Robin Collier is a 70 year old female with history of chronic migraine headache, she presents today for her fourth Botox injection she is past due, last injection was February 05, 2020.  Claims the Botox is 80% helpful for her migraines.  During Botox cycle, she denies any significant migraine headaches, she may have a few mild headaches weekly, but no severe headache.  Feels it has clearly benefited her.  She is dealing with a headache today, associated with some vertigo.  This is typical for her, she wishes to proceed with Botox.   Description of procedure:  The patient was placed in a sitting position. The standard protocol was used for Botox as follows, with 5 units of Botox injected at each site:   -Procerus muscle, midline injection  -Corrugator muscle, bilateral injection  -Frontalis muscle, bilateral injection, with 2 sites each side, medial injection was performed in the upper one third of the frontalis muscle, in the region vertical from the medial inferior edge of the superior orbital rim. The lateral injection was again in the upper one third of the forehead vertically above the lateral limbus of the cornea, 1.5 cm lateral to the medial injection site.  -Temporalis muscle injection, 4 sites, bilaterally. The first injection was 3 cm above the tragus of the ear, second injection site was 1.5 cm to 3 cm up from the first injection site in line with the tragus of the ear. The third injection site was 1.5-3 cm forward between the first 2 injection sites. The fourth injection site was 1.5 cm posterior to the second injection site.  -Occipitalis muscle injection, 3 sites, bilaterally. The first injection was done one half way between the occipital protuberance and the tip of the mastoid process behind the ear. The second injection site was done lateral and superior to the first, 1 fingerbreadth from the first injection. The  third injection site was 1 fingerbreadth superiorly and medially from the first injection site.  -Cervical paraspinal muscle injection, 2 sites, bilateral, the first injection site was 1 cm from the midline of the cervical spine, 3 cm inferior to the lower border of the occipital protuberance. The second injection site was 1.5 cm superiorly and laterally to the first injection site.  -Trapezius muscle injection was performed at 3 sites, bilaterally. The first injection site was in the upper trapezius muscle halfway between the inflection point of the neck, and the acromion. The second injection site was one half way between the acromion and the first injection site. The third injection was done between the first injection site and the inflection point of the neck.   A 200 unit bottle of Botox was used, 155 units were injected, the rest of the Botox was wasted. The patient tolerated the procedure well, there were no complications of the above procedure.  Botox NDC 7017-7939 Lot number [id number] Expiration date 01/2023 B/B

## 2020-08-31 ENCOUNTER — Ambulatory Visit (INDEPENDENT_AMBULATORY_CARE_PROVIDER_SITE_OTHER): Payer: Medicare Other | Admitting: Neurology

## 2020-08-31 DIAGNOSIS — G43019 Migraine without aura, intractable, without status migrainosus: Secondary | ICD-10-CM | POA: Diagnosis not present

## 2020-08-31 NOTE — Progress Notes (Signed)
Botox- 200 units x 1 vial Lot: [id number] Expiration: 02/2023 NDC: 0023-3921-02   Bacteriostatic 0.9% Sodium Chloride- 4mL total Lot:[id number] Expiration: 12/23 NDC: 63323-186-03   B/B 

## 2020-08-31 NOTE — Procedures (Signed)
     BOTOX PROCEDURE NOTE FOR MIGRAINE HEADACHE   HISTORY: Robin Collier presents today for her fifth Botox injection for chronic migraine headaches.  Her last injection was in April 2022.  Reports 80 to 90% improvement with Botox.  Since her last injection, has only had 1 significant migraine.  She continues with vertigo, has not done her vestibular exercises. Seems to worsen around time of botox injection, but this is her typical vertigo pattern. Wishes to proceed with Botox.    Description of procedure:  The patient was placed in a sitting position. The standard protocol was used for Botox as follows, with 5 units of Botox injected at each site:   -Procerus muscle, midline injection  -Corrugator muscle, bilateral injection  -Frontalis muscle, bilateral injection, with 2 sites each side, medial injection was performed in the upper one third of the frontalis muscle, in the region vertical from the medial inferior edge of the superior orbital rim. The lateral injection was again in the upper one third of the forehead vertically above the lateral limbus of the cornea, 1.5 cm lateral to the medial injection site.  -Temporalis muscle injection, 4 sites, bilaterally. The first injection was 3 cm above the tragus of the ear, second injection site was 1.5 cm to 3 cm up from the first injection site in line with the tragus of the ear. The third injection site was 1.5-3 cm forward between the first 2 injection sites. The fourth injection site was 1.5 cm posterior to the second injection site.  -Occipitalis muscle injection, 3 sites, bilaterally. The first injection was done one half way between the occipital protuberance and the tip of the mastoid process behind the ear. The second injection site was done lateral and superior to the first, 1 fingerbreadth from the first injection. The third injection site was 1 fingerbreadth superiorly and medially from the first injection site.  -Cervical paraspinal  muscle injection, 2 sites, bilateral, the first injection site was 1 cm from the midline of the cervical spine, 3 cm inferior to the lower border of the occipital protuberance. The second injection site was 1.5 cm superiorly and laterally to the first injection site.  -Trapezius muscle injection was performed at 3 sites, bilaterally. The first injection site was in the upper trapezius muscle halfway between the inflection point of the neck, and the acromion. The second injection site was one half way between the acromion and the first injection site. The third injection was done between the first injection site and the inflection point of the neck.   A 200 unit bottle of Botox was used, 155 units were injected, the rest of the Botox was wasted. The patient tolerated the procedure well, there were no complications of the above procedure.  Botox NDC 1610-9604-54 Lot number [id number] Expiration date 02/2023 B/B

## 2020-11-02 ENCOUNTER — Telehealth: Payer: Self-pay | Admitting: Neurology

## 2020-11-02 DIAGNOSIS — R404 Transient alteration of awareness: Secondary | ICD-10-CM

## 2020-11-02 NOTE — Telephone Encounter (Signed)
Pt's daughter states even while having Botox treatments pt is still having bad headaches.  Daughter states during some bad headaches pt has seizure like activity with no control of arms and legs.  Please call daughter to discuss.

## 2020-11-03 ENCOUNTER — Telehealth: Payer: Self-pay | Admitting: Neurology

## 2020-11-03 MED ORDER — UBRELVY 50 MG PO TABS: 50.0000 mg | ORAL_TABLET | Freq: Two times a day (BID) | ORAL | 2 refills | Status: DC | PRN

## 2020-11-03 NOTE — Telephone Encounter (Signed)
I called the daughter and the patient.  Patient's been having episodes of vertigo since 2010, they may be more frequent recently.  It was felt that the vertigo may possibly related to her migraine, she oftentimes has a headache, vertigo starts which may last 2 to 3 days.  The patient may fall down, have nausea, the daughter has noted that she may flail her arms and legs during the events without full loss of consciousness.   The patient takes Dramamine with some benefit with the vertigo.  She claims that vestibular rehab did not help her previously.  I will give her Bernita Raisin to take if needed for the events, set her up for an EEG study.  The episodes to me do not sound like seizures.

## 2020-11-03 NOTE — Telephone Encounter (Signed)
EEG order sent to [facility] for scheduling. 

## 2020-11-03 NOTE — Addendum Note (Signed)
Addended by: York Spaniel on: 11/03/2020 12:14 PM   Modules accepted: Orders

## 2020-11-09 ENCOUNTER — Ambulatory Visit (HOSPITAL_COMMUNITY)
Admission: RE | Admit: 2020-11-09 | Discharge: 2020-11-09 | Disposition: A | Discharge status: Home / Self Care | Payer: Medicare Other | Source: Ambulatory Visit | Attending: Neurology | Admitting: Neurology

## 2020-11-09 ENCOUNTER — Other Ambulatory Visit: Payer: Self-pay

## 2020-11-09 DIAGNOSIS — R404 Transient alteration of awareness: Secondary | ICD-10-CM

## 2020-11-09 DIAGNOSIS — R9401 Abnormal electroencephalogram [EEG]: Secondary | ICD-10-CM | POA: Insufficient documentation

## 2020-11-09 DIAGNOSIS — R464 Slowness and poor responsiveness: Secondary | ICD-10-CM | POA: Insufficient documentation

## 2020-11-09 NOTE — Progress Notes (Signed)
EEG completed, results pending. 

## 2020-11-10 ENCOUNTER — Telehealth: Payer: Self-pay | Admitting: Neurology

## 2020-11-10 ENCOUNTER — Telehealth (INDEPENDENT_AMBULATORY_CARE_PROVIDER_SITE_OTHER): Payer: Medicare Other | Admitting: Neurology

## 2020-11-10 DIAGNOSIS — R569 Unspecified convulsions: Secondary | ICD-10-CM

## 2020-11-10 NOTE — Telephone Encounter (Signed)
     History: Rashan Patient is a 70 year old patient with a history of essential tremor treated with propranolol.  The patient has had episodes of vertigo since 2010.  The episodes have become more frequent recently.  She also has a history of migraine headaches.  The episodes of vertigo may be associated with headache and may last up to 2 to 3 days.  She may fall down with the events and flail her arms but never loses consciousness.  The patient is concerned that she may be having seizures.  She is being evaluated for this issue.  This is a routine EEG.  No skull defects noted.  Medications include Norvasc, clonazepam, doxepin, lamotrigine, losartan, propranolol, Requip, Zoloft, trazodone, and Ubrelvy.  EEG classification: Dysrhythmia grade 1 generalized  Description of the recording: The background rhythms of this recording consists of fairly well-modulated medium amplitude theta frequency activity of 7 Hz.  This is reactive to eye opening closure.  As the record progresses, the patient appears to remain in the waking state.  Photic stimulation is performed and this resulted in a bilateral and symmetric photic driving response.  Hyperventilation resulted in minimal buildup of background rhythm activities without significant slowing seen.  At no time during the recording did there appear to be spike or spike-wave discharges or evidence of focal slowing.  EKG monitor shows no evidence of cardiac rhythm abnormalities with a heart rate of 60.  Impression: This is an abnormal EEG recording secondary to mild diffuse background slowing in the theta frequency range.  This is a nonspecific recording and can be seen in any dementing type illness or any mild metabolic or toxic encephalopathy.  Clinical correlation is required.  There is no evidence of any epileptiform discharges.  York Spaniel

## 2020-11-10 NOTE — Telephone Encounter (Signed)
I called the patient.  The EEG study did not show epileptiform discharges, no evidence of seizures.  The patient does have some mild background slowing that may sometimes be related to memory issues or metabolic or toxic effects.

## 2020-11-16 NOTE — Telephone Encounter (Signed)
Pt's daughter Selena Batten called asking for the results of her mothers EEG to be gone over with her. Requesting a call back.

## 2020-11-16 NOTE — Telephone Encounter (Signed)
I called the pt and pt's daughter ( ok per dpr) and discussed results off EEG.  They both verbalized understanding and will keep appts on the books as scheduled for October and November.

## 2020-12-14 ENCOUNTER — Telehealth: Payer: Self-pay

## 2020-12-14 ENCOUNTER — Ambulatory Visit (INDEPENDENT_AMBULATORY_CARE_PROVIDER_SITE_OTHER): Payer: Medicare Other | Admitting: Neurology

## 2020-12-14 ENCOUNTER — Other Ambulatory Visit: Payer: Self-pay

## 2020-12-14 ENCOUNTER — Encounter: Payer: Self-pay | Admitting: Neurology

## 2020-12-14 VITALS — BP 156/78 | HR 55

## 2020-12-14 DIAGNOSIS — G43019 Migraine without aura, intractable, without status migrainosus: Secondary | ICD-10-CM | POA: Diagnosis not present

## 2020-12-14 DIAGNOSIS — G479 Sleep disorder, unspecified: Secondary | ICD-10-CM

## 2020-12-14 MED ORDER — ONABOTULINUMTOXINA 100 UNITS IJ SOLR
200.0000 [IU] | Freq: Once | INTRAMUSCULAR | Status: AC
  Administered 2020-12-14: 200 [IU] via INTRAMUSCULAR

## 2020-12-14 NOTE — Telephone Encounter (Signed)
Can we call the pt and schedule f/u with Dr. Delena Bali. Thanks!

## 2020-12-14 NOTE — Telephone Encounter (Signed)
-----   Message from York Spaniel, MD sent at 12/14/2020 12:31 PM EDT ----- This patient will need a revisit in the next couple months with Dr. Delena Bali.  We will stop Botox therapies for now.

## 2020-12-14 NOTE — Progress Notes (Signed)
Botox- 200 units x 1 vial Lot: [id number] Expiration: 05/2023 NDC: 9355-2174-71  Bacteriostatic 0.9% Sodium Chloride- 66mL total [id number] Expiration: 12/23 NDC:63323-186-03  Dx: G43.019 BB

## 2020-12-14 NOTE — Procedures (Signed)
     BOTOX PROCEDURE NOTE FOR MIGRAINE HEADACHE   HISTORY: Robin Collier is a 70 year old patient with a history of intractable headache.  The patient has episodes of vertigo, she is having daily headaches.  Headaches are oftentimes worse in the morning.  She has associated excessive daytime drowsiness, she snores at night.  She does not believe that the Botox is helpful.   Description of procedure:  The patient was placed in a sitting position. The standard protocol was used for Botox as follows, with 5 units of Botox injected at each site:   -Procerus muscle, midline injection  -Corrugator muscle, bilateral injection  -Frontalis muscle, bilateral injection, with 2 sites each side, medial injection was performed in the upper one third of the frontalis muscle, in the region vertical from the medial inferior edge of the superior orbital rim. The lateral injection was again in the upper one third of the forehead vertically above the lateral limbus of the cornea, 1.5 cm lateral to the medial injection site.  -Temporalis muscle injection, 4 sites, bilaterally. The first injection was 3 cm above the tragus of the ear, second injection site was 1.5 cm to 3 cm up from the first injection site in line with the tragus of the ear. The third injection site was 1.5-3 cm forward between the first 2 injection sites. The fourth injection site was 1.5 cm posterior to the second injection site.  -Occipitalis muscle injection, 3 sites, bilaterally. The first injection was done one half way between the occipital protuberance and the tip of the mastoid process behind the ear. The second injection site was done lateral and superior to the first, 1 fingerbreadth from the first injection. The third injection site was 1 fingerbreadth superiorly and medially from the first injection site.  -Cervical paraspinal muscle injection, 2 sites, bilateral, the first injection site was 1 cm from the midline of the cervical  spine, 3 cm inferior to the lower border of the occipital protuberance. The second injection site was 1.5 cm superiorly and laterally to the first injection site.  -Trapezius muscle injection was performed at 3 sites, bilaterally. The first injection site was in the upper trapezius muscle halfway between the inflection point of the neck, and the acromion. The second injection site was one half way between the acromion and the first injection site. The third injection was done between the first injection site and the inflection point of the neck.   A 200 unit bottle of Botox was used, 155 units were injected, the rest of the Botox was wasted. The patient tolerated the procedure well, there were no complications of the above procedure.  Botox NDC 2094-7096-28 Lot number [id number] Expiration date April 2025  We will plan on stopping the Botox therapies at this point.  Patient does not believe that she is getting benefit.

## 2020-12-15 ENCOUNTER — Telehealth: Payer: Self-pay | Admitting: Neurology

## 2020-12-15 MED ORDER — NURTEC 75 MG PO TBDP: 75.0000 mg | ORAL_TABLET | ORAL | 3 refills | Status: DC

## 2020-12-15 NOTE — Telephone Encounter (Signed)
I called the daughter.  The patient is having ongoing's fairly severe headaches.  The Botox has not been helpful.  We discussed potentially going on Nurtec as a preventative, I will send the medication in.  They would like to have an earlier appointment with Dr. Delena Bali than in January 2023.  I will see about trying to get this set up.

## 2020-12-15 NOTE — Addendum Note (Signed)
Addended by: York Spaniel on: 12/15/2020 02:52 PM   Modules accepted: Orders

## 2020-12-15 NOTE — Telephone Encounter (Signed)
Pt's daughter, Fransico Michael (on [place]) called, would like a  call from the nurse discuss if Dr. Anne Hahn is going to prescribe Nurtec to my mother.

## 2020-12-15 NOTE — Telephone Encounter (Signed)
I called the pt's daughter back and have schedule f/u visit with Dr. Delena Bali on 12/21/2020 at 200 pm.

## 2020-12-16 NOTE — Telephone Encounter (Signed)
I called the daughter.  Nurtec has a FDA approved indication for prophylactic treatment for migraine headache.  In this capacity, it is taking 75 mg every other day.  The pharmacist may not have been aware that the Nurtec has a dual indication.  I discussed this with the daughter.

## 2020-12-16 NOTE — Telephone Encounter (Signed)
Pt's daughter called has some questions about the directions for her mothers Rimegepant Sulfate (NURTEC) 75 MG TBDP. Kim requesting a call back.

## 2020-12-16 NOTE — Telephone Encounter (Signed)
Called daughter, KIm who stated the pharmacist told her that the patient probably shouldn't take Nurtec every other day. She also asked why Dr Anne Hahn recommended it instead of Ubrelvy.  He had told them in Botox appointment but Selena Batten cannot remember. I was unable to answer her questions, advised will send to Dr Anne Hahn. Kim verbalized understanding, appreciation.

## 2020-12-21 ENCOUNTER — Ambulatory Visit: Payer: Medicare Other | Admitting: Psychiatry

## 2020-12-23 NOTE — Progress Notes (Deleted)
   CC:  headaches  Follow-up Visit  Last visit: 01/20/20  Brief HPI: 70 year old female with a history of essential tremor, HTN, depression who previously followed with Dr. Anne Hahn for migraines. Previously was being managed with Botox, last session on 08/31/20.  Interval History: *** Recently had an EEG in September for episodes of arm and leg flailing, which showed mild diffuse slowing and no epileptiform activity.   Headache days per month: *** Headache free days per month: *** Headache severity: ***  Current Headache Regimen: Preventative: *** Abortive: ***  # of doses of abortive medications per month: ***  Prior Therapies                                  Topamax - drowsiness, dizziness Propranolol Botox - lack of efficacy Gabapentin - nausea and vomiting Norvasc Losartan 25 mg daily Aimovig - helped somewhat but continued to have daily headaches Lamictal 200 mg BID Zoloft 100 mg daily  Physical Exam:   Vital Signs: There were no vitals taken for this visit. GENERAL:  well appearing, in no acute distress, alert  SKIN:  Color, texture, turgor normal. No rashes or lesions HEAD:  Normocephalic/atraumatic. RESP: normal respiratory effort MSK:  No gross joint deformities.   NEUROLOGICAL: Mental Status: Alert, oriented to person, place and time, Follows commands, and Speech fluent and appropriate. Cranial Nerves: PERRL, face symmetric, no dysarthria, hearing grossly intact Motor: moves all extremities equally Gait: normal-based.  IMPRESSION: ***  PLAN: *** Quilpita, ajovy, emgality, vyepti***  Follow-up: ***  I spent a total of *** minutes on the date of the service. Headache education was done. Discussed lifestyle modification including increased oral hydration, decreased caffeine, exercise and stress management. Discussed treatment options including preventive and acute medications, natural supplements, and infusion therapy. Discussed medication overuse  headache and to limit use of acute treatments to no more than 2 days/week or 10 days/month. Discussed medication side effects, adverse reactions and drug interactions. Written educational materials and patient instructions outlining all of the above were given.  Ocie Doyne, MD

## 2020-12-30 ENCOUNTER — Encounter: Payer: Self-pay | Admitting: Psychiatry

## 2020-12-30 ENCOUNTER — Ambulatory Visit: Payer: Medicare Other | Admitting: Psychiatry

## 2020-12-30 ENCOUNTER — Ambulatory Visit (INDEPENDENT_AMBULATORY_CARE_PROVIDER_SITE_OTHER): Payer: Medicare Other | Admitting: Psychiatry

## 2020-12-30 VITALS — BP 124/80 | HR 68

## 2020-12-30 DIAGNOSIS — G43719 Chronic migraine without aura, intractable, without status migrainosus: Secondary | ICD-10-CM | POA: Diagnosis not present

## 2020-12-30 DIAGNOSIS — R42 Dizziness and giddiness: Secondary | ICD-10-CM

## 2020-12-30 MED ORDER — CELECOXIB 50 MG PO CAPS: 50.0000 mg | ORAL_CAPSULE | Freq: Two times a day (BID) | ORAL | 0 refills | Status: AC

## 2020-12-30 MED ORDER — MECLIZINE HCL 25 MG PO TABS: 25.0000 mg | ORAL_TABLET | Freq: Three times a day (TID) | ORAL | 0 refills | Status: AC | PRN

## 2020-12-30 NOTE — Patient Instructions (Addendum)
Continue nurtec every other day for headache prevention Home PT Celebrex 2 times a day for the next 5 days to help break current headache cycle

## 2020-12-30 NOTE — Progress Notes (Signed)
   CC:  headaches  Follow-up Visit  Last visit: 12/14/20 for Botox  Brief HPI: 70 year old female with a history of essential tremor, migraines, and vertigo. Previously followed with Dr. Anne Hahn and was getting Botox injections. Had an EEG in September which showed mild diffuse encephalopathy.  Interval History: Had vertigo for three weeks straight recently. Botox was working initially but seems like it has stopped being effective. Has a constant headache every day. Headaches are associated with photophobia, phonophobia, and nausea. Meclizine helps a little. She currently takes it every day.  Only thing that has been helpful is when her chiropractor does the Epley maneuver.  Headache days per month: 30 Headache free days per month: 0  Current Headache Regimen: Preventative: nurtec every other day   Prior Therapies                                  Topamax Propranolol Amlodipine Losartan 25 mg daily Aimovig Botox Zoloft Gabapentin - nausea/vomiting Lamictal Nurtec Doxepin 75-150 mg QHS  Physical Exam:   Vital Signs: BP 124/80   Pulse 68  GENERAL:  well appearing, in no acute distress, alert  SKIN:  Color, texture, turgor normal. No rashes or lesions HEAD:  Normocephalic/atraumatic. RESP: normal respiratory effort MSK:  No gross joint deformities.   NEUROLOGICAL: Mental Status: Alert, oriented to person, place and time, Follows commands, and Speech fluent and appropriate. Cranial Nerves: PERRL, face symmetric, no dysarthria, hearing grossly intact Motor: moves all extremities equally Gait: normal-based.  IMPRESSION: 70 year old female who presents for follow up of migraines and vertigo. She continues to have daily headaches and vertigo. Recently started Nurtec every other day for prevention. Will continue this for now as it is too early to tell if it will be effective. They would be interested in Vyepti as a next step if Nurtec is ineffective. Discussed vestibular  therapy, however patient does not think her daughter would be able to drive her to appointments. Would like to try home PT if possible. Will prescribe 5 days of Celebrex to try and break her current headache as she has been in a cycle of constant headaches and vertigo for the past 3 weeks.   PLAN: -Continue Nurtec every other day for prevention -Vestibular therapy (will attempt to get home PT for this) -Next steps: consider vypeti, Qulipita, ajovy, emgality  Follow-up: 3 months  I spent a total of 48 minutes on the date of the service.  Discussed treatment options including preventive and acute medications, physical therapy, and infusion therapy. Written educational materials and patient instructions outlining all of the above were given.  Ocie Doyne, MD  12/30/20 4:18 PM

## 2021-01-05 ENCOUNTER — Telehealth: Payer: Self-pay | Admitting: Psychiatry

## 2021-01-05 NOTE — Telephone Encounter (Signed)
Pt's daughter Selena Batten called stating she has some questions that she forgot to ask during her mothers visit pertaining to the Vertigo/headaches. Kim requesting a call back.

## 2021-01-05 NOTE — Telephone Encounter (Signed)
Contacted daughter back, informed her Dr Quentin Mulling message. She doesn't believe its due to the medications as she has been on them for a while, but will also mention this to her other Drs and PCP.

## 2021-01-05 NOTE — Telephone Encounter (Signed)
Pt daughter states mother can't get out of bed in the morning at times, she feels weighted like she is being held down and wanted to know if it was a reaction to medications, sleep paralysis or symptoms of vertigo.  Any suggestions ?

## 2021-01-05 NOTE — Telephone Encounter (Signed)
If it's occurring right as she's waking up it could be related to sleep paralysis. Otherwise Trazodone and clonazepam can both cause fatigue/drowsiness which could contribute to a feeling of being held down.

## 2021-01-19 ENCOUNTER — Ambulatory Visit: Payer: Medicare Other | Admitting: Neurology

## 2021-02-04 ENCOUNTER — Telehealth: Payer: Self-pay | Admitting: Psychiatry

## 2021-02-04 ENCOUNTER — Other Ambulatory Visit: Payer: Self-pay | Admitting: Psychiatry

## 2021-02-04 MED ORDER — QULIPTA 60 MG PO TABS: 60.0000 mg | ORAL_TABLET | Freq: Every day | ORAL | 2 refills | Status: DC

## 2021-02-04 NOTE — Telephone Encounter (Signed)
Contacted pt daughter back, informed her of Dr Quentin Mulling recommendations. She was appreciative.

## 2021-02-04 NOTE — Telephone Encounter (Signed)
We can try Qulipta to help with her vertigo and headaches. I sent in a prescription to her pharmacy. She can stop Nurtec and start taking Qulipta (one pill daily) the day after her last Nurtec dose.

## 2021-02-04 NOTE — Telephone Encounter (Signed)
Pt's daughter has called to report that HTD:SKAJGOTLXB Sulfate (NURTEC) 75 MG TBDP is not working.  Pt has vertigo bad and is unable to get out of bed.  Daughter would like a call to discuss pt trying another medication.

## 2021-02-04 NOTE — Telephone Encounter (Signed)
Any other suggestions?

## 2021-02-08 ENCOUNTER — Other Ambulatory Visit: Payer: Self-pay | Admitting: Psychiatry

## 2021-02-25 ENCOUNTER — Institutional Professional Consult (permissible substitution): Payer: Medicare Other | Admitting: Neurology

## 2021-03-11 ENCOUNTER — Ambulatory Visit: Payer: Medicare Other | Admitting: Psychiatry

## 2021-03-22 ENCOUNTER — Telehealth: Payer: Self-pay | Admitting: Psychiatry

## 2021-03-22 NOTE — Telephone Encounter (Signed)
I called daughter back and lvm advising we do not have any samples of qulipta in the office. I advised if she would like for [name] to send the rx to another pharmacy we could do so and to let us know.

## 2021-03-22 NOTE — Telephone Encounter (Signed)
Pt's daughter states pt is out of her Atogepant (QULIPTA) 60 MG TABS and the pharmacy may not have more until tomorrow. She is wondering if we have any samples we can give her to tie her over. Would like to know ASAP as she lives in [place] and would have to get here before we close. Please advise at [phone].

## 2021-03-29 ENCOUNTER — Ambulatory Visit: Payer: Medicare Other | Admitting: Psychiatry

## 2021-04-13 ENCOUNTER — Ambulatory Visit: Payer: Medicare Other | Admitting: Neurology

## 2021-04-19 ENCOUNTER — Telehealth: Payer: Self-pay | Admitting: Psychiatry

## 2021-04-19 ENCOUNTER — Emergency Department (HOSPITAL_COMMUNITY)
Admission: EM | Admit: 2021-04-19 | Discharge: 2021-04-19 | Disposition: A | Discharge status: Home / Self Care | Payer: Medicare Other | Attending: Emergency Medicine | Admitting: Emergency Medicine

## 2021-04-19 ENCOUNTER — Emergency Department (HOSPITAL_COMMUNITY): Payer: Medicare Other

## 2021-04-19 ENCOUNTER — Ambulatory Visit: Payer: Medicare Other | Admitting: Psychiatry

## 2021-04-19 ENCOUNTER — Other Ambulatory Visit: Payer: Self-pay

## 2021-04-19 DIAGNOSIS — Z79899 Other long term (current) drug therapy: Secondary | ICD-10-CM | POA: Diagnosis not present

## 2021-04-19 DIAGNOSIS — R29810 Facial weakness: Secondary | ICD-10-CM | POA: Insufficient documentation

## 2021-04-19 DIAGNOSIS — E876 Hypokalemia: Secondary | ICD-10-CM | POA: Insufficient documentation

## 2021-04-19 DIAGNOSIS — R4781 Slurred speech: Secondary | ICD-10-CM

## 2021-04-19 DIAGNOSIS — I10 Essential (primary) hypertension: Secondary | ICD-10-CM | POA: Diagnosis not present

## 2021-04-19 DIAGNOSIS — R519 Headache, unspecified: Secondary | ICD-10-CM

## 2021-04-19 DIAGNOSIS — G8194 Hemiplegia, unspecified affecting left nondominant side: Secondary | ICD-10-CM | POA: Diagnosis not present

## 2021-04-19 LAB — DIFFERENTIAL
Abs Immature Granulocytes: 0.04 10*3/uL (ref 0.00–0.07)
Basophils Absolute: 0.1 10*3/uL (ref 0.0–0.1)
Basophils Relative: 1 %
Eosinophils Absolute: 0.1 10*3/uL (ref 0.0–0.5)
Eosinophils Relative: 2 %
Immature Granulocytes: 1 %
Lymphocytes Relative: 26 %
Lymphs Abs: 1.7 10*3/uL (ref 0.7–4.0)
Monocytes Absolute: 0.6 10*3/uL (ref 0.1–1.0)
Monocytes Relative: 10 %
Neutro Abs: 3.9 10*3/uL (ref 1.7–7.7)
Neutrophils Relative %: 60 %

## 2021-04-19 LAB — CBC
HCT: 46.5 % — ABNORMAL HIGH (ref 36.0–46.0)
Hemoglobin: 15.2 g/dL — ABNORMAL HIGH (ref 12.0–15.0)
MCH: 29.8 pg (ref 26.0–34.0)
MCHC: 32.7 g/dL (ref 30.0–36.0)
MCV: 91.2 fL (ref 80.0–100.0)
Platelets: 361 10*3/uL (ref 150–400)
RBC: 5.1 MIL/uL (ref 3.87–5.11)
RDW: 13.4 % (ref 11.5–15.5)
WBC: 6.4 10*3/uL (ref 4.0–10.5)
nRBC: 0 % (ref 0.0–0.2)

## 2021-04-19 LAB — APTT: aPTT: 28 seconds (ref 24–36)

## 2021-04-19 LAB — I-STAT CHEM 8, ED
BUN: 7 mg/dL — ABNORMAL LOW (ref 8–23)
Calcium, Ion: 0.98 mmol/L — ABNORMAL LOW (ref 1.15–1.40)
Chloride: 99 mmol/L (ref 98–111)
Creatinine, Ser: 0.8 mg/dL (ref 0.44–1.00)
Glucose, Bld: 119 mg/dL — ABNORMAL HIGH (ref 70–99)
HCT: 46 % (ref 36.0–46.0)
Hemoglobin: 15.6 g/dL — ABNORMAL HIGH (ref 12.0–15.0)
Potassium: 3.1 mmol/L — ABNORMAL LOW (ref 3.5–5.1)
Sodium: 141 mmol/L (ref 135–145)
TCO2: 32 mmol/L (ref 22–32)

## 2021-04-19 LAB — URINALYSIS, ROUTINE W REFLEX MICROSCOPIC
Bilirubin Urine: NEGATIVE
Glucose, UA: NEGATIVE mg/dL
Hgb urine dipstick: NEGATIVE
Ketones, ur: NEGATIVE mg/dL
Nitrite: NEGATIVE
Protein, ur: NEGATIVE mg/dL
Specific Gravity, Urine: 1.006 (ref 1.005–1.030)
pH: 7 (ref 5.0–8.0)

## 2021-04-19 LAB — COMPREHENSIVE METABOLIC PANEL
ALT: 5 U/L (ref 0–44)
AST: 32 U/L (ref 15–41)
Albumin: 3.8 g/dL (ref 3.5–5.0)
Alkaline Phosphatase: 60 U/L (ref 38–126)
Anion gap: 14 (ref 5–15)
BUN: 7 mg/dL — ABNORMAL LOW (ref 8–23)
CO2: 28 mmol/L (ref 22–32)
Calcium: 9.3 mg/dL (ref 8.9–10.3)
Chloride: 98 mmol/L (ref 98–111)
Creatinine, Ser: 0.98 mg/dL (ref 0.44–1.00)
GFR, Estimated: >60 mL/min (ref >60)
Glucose, Bld: 120 mg/dL — ABNORMAL HIGH (ref 70–99)
Potassium: 4 mmol/L (ref 3.5–5.1)
Sodium: 140 mmol/L (ref 135–145)
Total Bilirubin: 2 mg/dL — ABNORMAL HIGH (ref 0.3–1.2)
Total Protein: 6.8 g/dL (ref 6.5–8.1)

## 2021-04-19 LAB — CBG MONITORING, ED: Glucose-Capillary: 126 mg/dL — ABNORMAL HIGH (ref 70–99)

## 2021-04-19 LAB — PROTIME-INR
INR: 1 (ref 0.8–1.2)
Prothrombin Time: 12.8 seconds (ref 11.4–15.2)

## 2021-04-19 MED ORDER — LORAZEPAM 2 MG/ML IJ SOLN
0.5000 mg | Freq: Once | INTRAMUSCULAR | Status: AC
  Administered 2021-04-19: 0.5 mg via INTRAVENOUS
  Filled 2021-04-19: qty 1

## 2021-04-19 MED ORDER — SODIUM CHLORIDE 0.9% FLUSH
3.0000 mL | Freq: Once | INTRAVENOUS | Status: AC
  Administered 2021-04-19: 3 mL via INTRAVENOUS

## 2021-04-19 MED ORDER — ACETAMINOPHEN 500 MG PO TABS
1000.0000 mg | ORAL_TABLET | Freq: Once | ORAL | Status: AC
  Administered 2021-04-19: 1000 mg via ORAL
  Filled 2021-04-19: qty 2

## 2021-04-19 NOTE — Code Documentation (Signed)
Stroke Response Nurse Documentation Code Documentation  Robin Collier is a 71 y.o. female arriving to [facility]  via [facility] on 04/19/21 with past medical hx of migraines, HTN, vertigo. On No antithrombotic. Code stroke was activated by EMS.   Patient from home where she was LKW at 1000. Her helper noticed she had slurred speech so she called the patients daughter. When the daughter arrived she mentions she didn't think her speech was slurred. They sat down to eat lunch and patient persisted that she did not feel well so daughter called EMS. Patient with delayed responses.  Stroke team at the bedside on patient arrival. Labs drawn and patient cleared for CT by EDP. Patient to CT with team. NIHSS 0, see documentation for details and code stroke times. The following imaging was completed:  CT Head. Patient is not a candidate for IV Thrombolytic due to OOW. Patient is not not a candidate for IR due to no suspected LVO.   Care Plan: Obtain MRI and Q2 NIHSS.  Bedside handoff with ED RN Alexa  Toniann Fail  Stroke Response RN

## 2021-04-19 NOTE — Discharge Instructions (Signed)
You have been seen and discharged from the emergency department.  Your blood work and CT of her head/MRI of her head were normal.  We believe her symptoms are secondary to a complicated migraine.  Follow-up with your primary provider for further evaluation and further care. Take home medications as prescribed. If you have any worsening symptoms or further concerns for your health please return to an emergency department for further evaluation.

## 2021-04-19 NOTE — ED Notes (Signed)
Patient transported to MRI 

## 2021-04-19 NOTE — ED Provider Notes (Signed)
[facility] EMERGENCY DEPARTMENT Provider Note   CSN: [id number] Arrival date & time: 04/19/21  1523     History  No chief complaint on file.   Robin Collier is a 71 y.o. female.  HPI  71 year old female with past medical history of vertigo, migraines, essential tremor, HTN presents emergency department as a code stroke.  Report from EMS is that she was last seen normal at 10 AM approximately 5-1/2 hours ago.  She is reported by family to be high functioning and independent.  Today they find her to be slow, sluggish, concern for right-sided facial droop and left-sided weakness as well as slurred speech.  Of note patient had a fall on Thursday with left eyebrow injury that she was not evaluated for.  Not on any blood thinning medication.  No reported recent illness, fever.  No recent vomiting/diarrhea.  Patient states that she feels generally unwell, denies head or neck pain.  History limited secondary to acuity. CBG PTA 109  Home Medications Prior to Admission medications   Medication Sig Start Date End Date Taking? Authorizing Provider  acetaminophen (TYLENOL) 500 MG tablet Take 1,000-1,500 mg by mouth 3 (three) times daily as needed for moderate pain.    [provider]  amLODipine (NORVASC) 5 MG tablet Take 5 mg by mouth daily. 10/12/18   [provider]  Atogepant (QULIPTA) 60 MG TABS Take 60 mg by mouth daily. 02/04/21   Ocie Doyne, MD  atorvastatin (LIPITOR) 10 MG tablet Take 10 mg by mouth daily. 12/16/20   [provider]  clonazePAM (KLONOPIN) 0.5 MG tablet Take 0.5 mg by mouth 2 (two) times daily.     [provider]  cyanocobalamin (,VITAMIN B-12,) 1000 MCG/ML injection Inject 1,000 mcg into the muscle every 30 (thirty) days.    [provider]  doxepin (SINEQUAN) 75 MG capsule Take 75-150 mg by mouth at bedtime. Patient not taking: Reported on 12/30/2020 05/13/19   [provider]  lamoTRIgine  (LAMICTAL) 200 MG tablet Take 200 mg by mouth 2 (two) times daily.     [provider]  losartan (COZAAR) 25 MG tablet Take by mouth. Patient not taking: Reported on 12/30/2020 03/07/19   [provider]  LOTEMAX SM 0.38 % GEL  01/03/20   [provider]  propranolol ER (INDERAL LA) 60 MG 24 hr capsule Take 60 mg by mouth daily.     [provider]  ropinirole (REQUIP) 5 MG tablet Take 4 mg by mouth daily. Been taking at night     [provider]  sertraline (ZOLOFT) 100 MG tablet Take 100 mg by mouth daily.     [provider]  traZODone (DESYREL) 100 MG tablet Take 300 mg by mouth at bedtime.     [provider]      Allergies    Gabapentin and Hydrocodone    Review of Systems   Review of Systems  Unable to perform ROS: Acuity of condition   Physical Exam Updated Vital Signs BP (!) 192/88    Pulse (!) 57    Wt 81.4 kg    SpO2 92%    BMI 30.80 kg/m  Physical Exam Vitals and nursing note reviewed.  Constitutional:      General: She is not in acute distress.    Appearance: Normal appearance. She is not diaphoretic.  HENT:     Head: Normocephalic.     Mouth/Throat:     Mouth: Mucous membranes are moist.  Cardiovascular:     Rate and Rhythm: Normal rate.  Pulmonary:     Effort: Pulmonary effort is normal. No respiratory distress.  Abdominal:     Palpations: Abdomen is soft.     Tenderness: There is no abdominal tenderness.  Skin:    General: Skin is warm.  Neurological:     Mental Status: She is alert and oriented to person, place, and time.  Psychiatric:        Mood and Affect: Mood normal.    ED Results / Procedures / Treatments   Labs (all labs ordered are listed, but only abnormal results are displayed) Labs Reviewed  I-STAT CHEM 8, ED - Abnormal; Notable for the following components:      Result Value   Potassium 3.1 (*)    BUN 7 (*)    Glucose, Bld 119 (*)    Calcium, Ion 0.98 (*)    Hemoglobin 15.6  (*)    All other components within normal limits  CBG MONITORING, ED - Abnormal; Notable for the following components:   Glucose-Capillary 126 (*)    All other components within normal limits  PROTIME-INR  APTT  CBC  DIFFERENTIAL  COMPREHENSIVE METABOLIC PANEL  URINALYSIS, ROUTINE W REFLEX MICROSCOPIC    EKG None  Radiology CT HEAD CODE STROKE WO CONTRAST  Result Date: 04/19/2021 CLINICAL DATA:  Code stroke. Neuro deficit, acute, stroke suspected; left-sided weakness EXAM: CT HEAD WITHOUT CONTRAST TECHNIQUE: Contiguous axial images were obtained from the base of the skull through the vertex without intravenous contrast. RADIATION DOSE REDUCTION: This exam was performed according to the departmental dose-optimization program which includes automated exposure control, adjustment of the mA and/or kV according to patient size and/or use of iterative reconstruction technique. COMPARISON:  09/26/2020 FINDINGS: Brain: No acute intracranial hemorrhage, mass effect, or edema. No new loss of gray-white differentiation. Ventricles and sulci are normal in size and configuration. There is no extra-axial collection. Vascular: No definite hyperdense vessel. There may be increased density of the right carotid terminus and M1 MCA origin. Skull: Unremarkable. Sinuses/Orbits: No acute abnormality. Other: Mastoid air cells are clear. ASPECTS (Alberta Stroke Program Early CT Score) - Ganglionic level infarction (caudate, lentiform nuclei, internal capsule, insula, M1-M3 cortex): 7 - Supraganglionic infarction (M4-M6 cortex): 3 Total score (0-10 with 10 being normal): 10 IMPRESSION: There is no acute intracranial hemorrhage or evidence of acute infarction. ASPECT score is 10. Possible hyperdensity of the right carotid terminus and MCA origin. These results were communicated to Dr. Derry Lory at 3:41 pm on 04/19/2021 by text page via the [facility] messaging system. Electronically Signed   By: Guadlupe Spanish M.D.   On:  04/19/2021 15:47    Procedures .Critical Care Performed by: Rozelle Logan, DO Authorized by: Rozelle Logan, DO   Critical care provider statement:    Critical care time (minutes):  60   Critical care time was exclusive of:  Separately billable procedures and treating other patients   Critical care was necessary to treat or prevent imminent or life-threatening deterioration of the following conditions:  CNS failure or compromise   Critical care was time spent personally by me on the following activities:  Development of treatment plan with patient or surrogate, discussions with consultants, evaluation of patient's response to treatment, examination of patient, ordering and review of laboratory studies, ordering and review of radiographic studies, ordering and performing treatments and interventions, pulse oximetry, re-evaluation of patient's condition and review of old charts   I assumed direction of  critical care for this patient from another provider in my specialty: no      Medications Ordered in ED Medications  sodium chloride flush (NS) 0.9 % injection 3 mL (has no administration in time range)    ED Course/ Medical Decision Making/ A&P                           Medical Decision Making Amount and/or Complexity of Data Reviewed Labs: ordered. Radiology: ordered.  Risk OTC drugs. Prescription drug management.   This patient presents to the ED for concern of left-sided weakness, possible facial droop, this involves an extensive number of treatment options, and is a complaint that carries with it a high risk of complications and morbidity.  The differential diagnosis includes complicated migraine, TIA, CVA, ICH.  Level 5 caveat, patient seen as a code stroke, history limited to acuity   Additional history obtained: -Additional history obtained from family members -External records from outside source obtained and reviewed including: Chart review including previous notes,  labs, imaging, consultation notes   Lab Tests: -I ordered, reviewed, and interpreted labs.  The pertinent results include: Baseline, mild hypokalemia   EKG -Sinus rhythm   Imaging Studies ordered: -I ordered imaging studies including CT of the head, MRI of the head -I independently visualized and interpreted imaging which showed no findings of acute TIA, bleed or stroke -I agree with the radiologist interpretation   Medicines ordered and prescription drug management: -I ordered medication including Tylenol  for headache   -Reevaluation of the patient after these medicines showed that the patient improved -I have reviewed the patients home medicines and have made adjustments as needed   ED Course: 71 year old female presents emergency department as a code stroke for concern of left-sided weakness and facial droop.  Patient seen as a code stroke, neurology team at bedside, head CT unremarkable.  Metabolic work-up is reassuring, no UTI.  Plan for MRI which was negative for any CVA.  Reviewed the work-up with neurology who agrees that this is most likely a complicated migraine and she is suitable for continued outpatient follow-up and work-up.  On my reevaluation her headache is improved with Tylenol, she is neuro intact and baseline.  Has no complaints.   Consultations Obtained: I requested consultation with the stroke neurologist, Dr. Derry Lory,  and discussed lab and imaging findings as well as pertinent plan - they recommend: Outpatient follow-up   Critical Interventions: Code stroke   Cardiac Monitoring: The patient was maintained on a cardiac monitor.  I personally viewed and interpreted the cardiac monitored which showed an underlying rhythm of: Sinus   Reevaluation: After the interventions noted above, I reevaluated the patient and found that they have :resolved   Dispostion: Patient at this time appears safe and stable for discharge and close outpatient follow up.  Discharge plan and strict return to ED precautions discussed, patient verbalizes understanding and agreement.        Final Clinical Impression(s) / ED Diagnoses Final diagnoses:  None    Rx / DC Orders ED Discharge Orders     None         Rozelle Logan, DO 04/19/21 2247

## 2021-04-19 NOTE — Consult Note (Addendum)
Neurology Code Stroke Consult H&P  Robin Collier MR# [id number] 04/19/2021   CC: 71 yo brought in by EMS as Code stroke after daughter was called when [facility] aide witnessed episode of slurred speech and delayed responses that both resolved by the time she presented to ED.   History is obtained from: patient daughter and chart.  HPI: Robin Collier is a 71 y.o. female with past medical history of vertigo, migraines, essential tremor, HTN presents emergency department as a code stroke. Report from EMS and daughter. Patient' helper noted slurred speech in AM around 10AM and notified daughter who evaluated patient and did not feel her speech was off. Daughter went to run some errands and came back and saw that her speech was very slurred. Attempted to have her eat but could only eat 2 bites of a sandwich and wasn't feeling well so they called EMS. She was brought in as a stroke code by EMS for significant slurring of her speech. Of note patient had a fall on Thursday with left eyebrow injury that she was not evaluated for.  Not on any blood thinning medication.  No reported recent illness, fever.  No recent vomiting/diarrhea.  Patient states that she feels generally unwell, denies head or neck pain.  Also of note patient and daughter say she has these same episodes often   LKW: 10am tNK given: No, outside window  IR Thrombectomy No, No LVO Modified Rankin Scale: 0-Completely asymptomatic and back to baseline post- stroke NIHSS: 0 Interval: Initial (02/27 1535) Level of Consciousness (1a.)   : Alert, keenly responsive (02/27 1600) LOC Questions (1b. )   +: Answers both questions correctly (02/27 1600) LOC Commands (1c. )   + : Performs both tasks correctly (02/27 1600) Best Gaze (2. )  +: Normal (02/27 1600) Visual (3. )  +: No visual loss (02/27 1600) Facial Palsy (4. )    : Normal symmetrical movements (02/27 1600) Motor Arm, Left (5a. )   +: No drift (02/27 1600) Motor Arm, Right (5b. )    +: No drift (02/27 1600) Motor Leg, Left (6a. )   +: No drift (02/27 1600) Motor Leg, Right (6b. )   +: No drift (02/27 1600) Limb Ataxia (7. ): Absent (02/27 1600) Sensory (8. )   +: Normal, no sensory loss (02/27 1600) Best Language (9. )   +: No aphasia (02/27 1600) Dysarthria (10. ): Normal (02/27 1600) Extinction/Inattention (11.)   +: No Abnormality (02/27 1600) Modified SS Total  +: 0 (02/27 1600) Complete NIHSS TOTAL: 0 (02/27 1600)   ROS: A complete ROS was performed and is negative except as noted in the HPI.    Past Medical History:  Diagnosis Date   Anxiety    Panic attacks   Arthritis    Bipolar disorder [facility])    one Dr says yes, one says no   Claustrophobia    Common migraine with intractable migraine 02/15/2017   Chronic daily headache   Constipation    Depression    Diplopia 02/15/2017   Diverticulitis    Fatty liver    Gait abnormality 02/15/2017   GERD (gastroesophageal reflux disease)    Headache(784.0)    migraines   Hypertension    Dr Shary Decamp told me that I do not have high blood preazaure and I am not on medication   IBS (irritable bowel syndrome)    Lumbosacral spondylosis with radiculopathy    Mental disorder    Restless legs syndrome  Seizures (HCC)    "not really seziure"   Tremor, essential 02/15/2017   Tremors of nervous system    Vertigo    06/06/16-  last 1. 5 years     Family History  Problem Relation Age of Onset   Cancer Mother    Cancer Father    Diabetes Father     Social History:  reports that she has never smoked. She has never used smokeless tobacco. She reports that she does not drink alcohol and does not use drugs.   Prior to Admission medications   Medication Sig Start Date End Date Taking? Authorizing Provider  acetaminophen (TYLENOL) 500 MG tablet Take 1,000-1,500 mg by mouth 3 (three) times daily as needed for moderate pain.    [provider]  amLODipine (NORVASC) 5 MG tablet Take 5 mg by mouth daily.  10/12/18   [provider]  Atogepant (QULIPTA) 60 MG TABS Take 60 mg by mouth daily. 02/04/21   Ocie Doyne, MD  atorvastatin (LIPITOR) 10 MG tablet Take 10 mg by mouth daily. 12/16/20   [provider]  clonazePAM (KLONOPIN) 0.5 MG tablet Take 0.5 mg by mouth 2 (two) times daily.     [provider]  cyanocobalamin (,VITAMIN B-12,) 1000 MCG/ML injection Inject 1,000 mcg into the muscle every 30 (thirty) days.    [provider]  doxepin (SINEQUAN) 75 MG capsule Take 75-150 mg by mouth at bedtime. Patient not taking: Reported on 12/30/2020 05/13/19   [provider]  lamoTRIgine (LAMICTAL) 200 MG tablet Take 200 mg by mouth 2 (two) times daily.     [provider]  losartan (COZAAR) 25 MG tablet Take by mouth. Patient not taking: Reported on 12/30/2020 03/07/19   [provider]  LOTEMAX SM 0.38 % GEL  01/03/20   [provider]  propranolol ER (INDERAL LA) 60 MG 24 hr capsule Take 60 mg by mouth daily.     [provider]  ropinirole (REQUIP) 5 MG tablet Take 4 mg by mouth daily. Been taking at night     [provider]  sertraline (ZOLOFT) 100 MG tablet Take 100 mg by mouth daily.     [provider]  traZODone (DESYREL) 100 MG tablet Take 300 mg by mouth at bedtime.     [provider]    Exam: Current vital signs: BP (!) 156/80    Pulse (!) 59    Temp 98.3 F (36.8 C) (Oral)    Resp 10    Wt 81.4 kg    SpO2 91%    BMI 30.80 kg/m   Physical Exam  Constitutional: Appears well-developed and well-nourished.  Psych: Affect appropriate to situation Eyes: No scleral injection HENT: No OP obstruction. Head: Normocephalic.  Cardiovascular: Normal rate and regular rhythm.  Respiratory: Effort normal, symmetric excursions bilaterally, no audible wheezing. GI: Soft.  No distension. There is no tenderness.  Skin: WDI laceration over left eye healing  Neuro: Mental Status: Patient is  awake, alert, oriented to person, place, month, year, and situation. Patient is able to give a clear and coherent history. Speech daughter endorses back to normal now. fluent, intact comprehension and repetition. No signs of aphasia or neglect. Visual Fields are full. Pupils are equal, round, and reactive to light. EOMI without ptosis or diploplia.  Facial sensation is symmetric to temperature Facial movement is symmetric.  Hearing is intact to voice. Uvula midline and palate elevates symmetrically. Shoulder shrug is symmetric. Tongue is midline without atrophy  or fasciculations.  Tone is normal. Bulk is normal. 5/5 strength was present in all four extremities. Sensation is symmetric to light touch and temperature in the arms and legs. Deep Tendon Reflexes: 2+ and symmetric in the biceps and patellae. Toes are downgoing bilaterally. FNF and HKS are intact bilaterally. Gait - Deferred  I have reviewed labs in epic and the pertinent results are: H&H- 15.2/46.5  I have reviewed the images obtained: NCT head showed There is no acute intracranial hemorrhage or evidence of acute infarction. ASPECT score is 10.Possible hyperdensity of the right carotid terminus and MCA origin.  Assessment: Robin Collier is a 71 y.o. female PMHx past medical history of vertigo, migraines, essential tremor, HTN presents emergency department as a code stroke. Report from EMS and daughter is that she was last seen normal at 10 AM approximately 5-1/2 hours ago.  Daughter says patient is high functioning and independent.  Today they find her to be slow, sluggish, concern for right-sided facial droop and left-sided weakness as well as slurred speech.  Of note patient had a fall on Thursday with left eyebrow injury that she was not evaluated for.  Not on any blood thinning medication.  No reported recent illness, fever.  No recent vomiting/diarrhea.  Patient states that she feels generally unwell, denies head or  neck pain.  Also of note patient and daughter say she has these same episodes often     Impression:  Patient with history of migraines and has a hx of episodes similar to the one today although she reports they usually are over quicker because she takes a klonopin and tylenol and goes to sleep and when she awakes she is better. Stroke unlikely but will get MRI to rule out.   Plan: - MRI brain without contrast was obtained and personally reviewed and was negative for an acute stroke. - No further inpatient workup, follow up with Dr. Ocie Doyne with [facility].   Electronically signed by:  Boone Master, Neuro NP  Available via Epic  04/19/2021, 6:23 PM  If 7pm- 7am, please page neurology on call as listed in AMION.

## 2021-04-19 NOTE — Telephone Encounter (Signed)
Pt's daughter is asking if there are any other medications pt can try. Daughter doesn't feel the Atogepant (QULIPTA) 60 MG TABS is really helping.

## 2021-04-19 NOTE — ED Triage Notes (Signed)
Pt bib [facility] from home with left side weakness, facial droop, and slurred speech. Pt LKW is 10am today. Pt does have hx of UTI/ Pt also fell face first on Thursday, she did not go to the hospital or urgent care to get seen. Pt is not on blood thinners.  EMS vitals  190/80 BP 109 CBG 54-60 HR

## 2021-04-20 NOTE — Telephone Encounter (Signed)
I called pt's daughter back and relayed information on alternatives. She is going to discuss with the pt and let me know how to proceed.

## 2021-04-20 NOTE — Telephone Encounter (Signed)
We could either try a monthly injection like Ajovy/Emgality, which might work better than the Aimovig she took previously. Or we could set her up for Vyepti if she prefers to try an infusion every 3 months

## 2021-05-17 ENCOUNTER — Ambulatory Visit (INDEPENDENT_AMBULATORY_CARE_PROVIDER_SITE_OTHER): Payer: Medicare Other | Admitting: Psychiatry

## 2021-05-17 ENCOUNTER — Other Ambulatory Visit: Payer: Self-pay | Admitting: Psychiatry

## 2021-05-17 ENCOUNTER — Encounter: Payer: Self-pay | Admitting: Psychiatry

## 2021-05-17 VITALS — BP 122/82 | HR 59 | Ht 62.0 in | Wt 175.0 lb

## 2021-05-17 DIAGNOSIS — G43019 Migraine without aura, intractable, without status migrainosus: Secondary | ICD-10-CM

## 2021-05-17 DIAGNOSIS — R42 Dizziness and giddiness: Secondary | ICD-10-CM | POA: Diagnosis not present

## 2021-05-17 MED ORDER — ONDANSETRON HCL 8 MG PO TABS: 8.0000 mg | ORAL_TABLET | Freq: Three times a day (TID) | ORAL | 3 refills | Status: AC | PRN

## 2021-05-17 MED ORDER — EMGALITY 120 MG/ML ~~LOC~~ SOAJ: 2.0000 "pen " | Freq: Once | SUBCUTANEOUS | 0 refills | Status: AC

## 2021-05-17 MED ORDER — EMGALITY 120 MG/ML ~~LOC~~ SOAJ: 1.0000 "pen " | SUBCUTANEOUS | 3 refills | Status: DC

## 2021-05-17 NOTE — Patient Instructions (Addendum)
Schedule an occipital nerve block with me ?Referral to ENT ?Start Emgality monthly for headache prevention. First dose is 2 injections, then every dose after that is one injections ?Start ondansetron (Zofran) as needed for nausea ?

## 2021-05-17 NOTE — Progress Notes (Signed)
? ?  CC:  headaches ? ?Follow-up Visit ? ?Last visit: 12/30/20 ? ?Brief HPI: ?71 year old female with a history of essential tremor who follows in clinic for migraine and vertigo. ? ?At her last visit, Nurtec every other day was continued for prevention. Referral was placed to vestibular therapy. ? ?Interval History: ?She did not notice improvement on Nurtec, so this was switched to [place] for prevention. Qulipta made her dizziness worse so she stopped it. She continues to have migraines every morning when she wakes up. They are associated with phonophobia and nausea. ? ?She continues to struggle with vertigo. She was unable to do vestibular therapy as she cannot make it to weekly appointments. ? ?She presented to the ED 04/19/21 for headache and weakness. MRI brain was unremarkable. ? ? ?Headache days per month: 30 ?Headache free days per month: 0 ? ? ?Prior Therapies                                  ?Topamax ?Lamictal ?Gabapentin - nausea/vomiting ?Propranolol ?Amlodipine ?Losartan 25 mg daily ?Aimovig ?Zoloft ?Doxepin 75-150 mg QHS ?Nurtec every other day - lack of efficacy ?Qulipta - dizziness ?Botox ?Flexeril - drowsiness ? ?Physical Exam:  ? ?Vital Signs: ?BP 122/82   Pulse (!) 59   Ht 5\' 2"  (1.575 m)   Wt 175 lb (79.4 kg)   SpO2 93%   BMI 32.01 kg/m?  ?GENERAL:  well appearing, in no acute distress, alert  ?SKIN:  Color, texture, turgor normal. No rashes or lesions ?HEAD:  Normocephalic/atraumatic. ?RESP: normal respiratory effort ?MSK:  Tenderness to palpation over bilateral occiput, neck, and shoulders ? ?NEUROLOGICAL: ?Mental Status: Alert, oriented to person, place and time, Follows commands, and Speech fluent and appropriate. ?Cranial Nerves: PERRL, face symmetric, no dysarthria, hearing grossly intact ?Motor: moves all extremities equally ?Gait: normal-based. ? ?IMPRESSION: ?71 year old female with a history of essential tremor who follows in clinic for migraines and vertigo. She was unable to  tolerate Qulipta. Will start Emgality for headache prevention. Will also plan for occipital nerve block as she has tenderness over bilateral occipital notches and neck. She does not believe she would be able to do any physical therapy due to transportation limitations. She would like to be referred to ENT for second opinion for vertigo as she has a history of ear and sinus issues and would like to know if this could be related to her vertigo. ? ?PLAN: ?-Preventive: Start Emgality monthly ?-Rescue: Start Zofran PRN for nausea ?-Return to clinic for occipital nerve block ?-Referral to ENT ? ?Follow-up: 4 months ? ?I spent a total of 41 minutes on the date of the service. Headache education was done. Discussed medication side effects, adverse reactions and drug interactions. Written educational materials and patient instructions outlining all of the above were given. ? ?62, MD ?05/17/21 ?4:10 PM ? ?

## 2021-05-19 ENCOUNTER — Telehealth: Payer: Self-pay | Admitting: Psychiatry

## 2021-05-19 NOTE — Telephone Encounter (Signed)
Referral sent to [facility] [phone]. °

## 2021-05-25 ENCOUNTER — Telehealth: Payer: Self-pay | Admitting: Psychiatry

## 2021-05-25 NOTE — Telephone Encounter (Signed)
Contacted daughter back, informed her pharmacy is getting another pen ready for her pick up. She was appreciative.  ?

## 2021-05-25 NOTE — Telephone Encounter (Signed)
Pt's daughter, Fransico Michael at last OV Dr. Delena Bali instructed "Start Emgality monthly for headache prevention. First dose is 2 injections, then every dose after that is one injections." Pharmacy only gave [name] 1 injection. She still needs the dosage for the first 2 injections. Can you send prescription for second injection for the first dose. Would like a call from the nurse ?

## 2021-05-25 NOTE — Telephone Encounter (Signed)
Contacted pharmacy for verification of Rx. Pt is to take Emgality monthly for headache prevention. First dose is 2 injections, then every dose after that is one injections. Pharmacist stated MD wrote Rx for 1 ML which is why  only one pen was dispense.  Gave her VO to dispense another to Pt for first injection.  ?

## 2021-05-26 ENCOUNTER — Ambulatory Visit (INDEPENDENT_AMBULATORY_CARE_PROVIDER_SITE_OTHER): Payer: Medicare Other | Admitting: Psychiatry

## 2021-05-26 VITALS — BP 132/61 | HR 56

## 2021-05-26 DIAGNOSIS — M542 Cervicalgia: Secondary | ICD-10-CM | POA: Diagnosis not present

## 2021-05-26 DIAGNOSIS — M5481 Occipital neuralgia: Secondary | ICD-10-CM | POA: Diagnosis not present

## 2021-05-26 NOTE — Progress Notes (Addendum)
Procedure: Occipital Nerve block/Trigger point injection ? ?Location: bilateral occiput ? ?The risks, benefits and anticipated outcomes of the procedure, the risks and benefits of the alternatives to the procedure, and the roles and tasks of the personnel to be involved, were discussed with the patient, and the patient consents to the procedure and agrees to proceed.   ?  ?A combination of 1 cc betamethasone 6 mg (lot [id number], exp 3/24) and 4 cc of 0.25% bupivacaine (lot [id number], exp 02/28/22)were prepared in 2 syringes (5 cc).  2 trigger points on the splenius capitus were identified and injected. The left and right greater occipital nerves were injected 3cm caudal and 1.5 cm lateral to the inion where the main trunk of the occipital nerve penetrates the semispinalis muscle.  The needle was placed perpendicular and the needle advanced 1.5 cm. After aspiration to ensure no obstruction or presence of blood, the area was injected.  The needle was repositioned in a fan-like manner and the entire area was injected. Pressure was held and no hematoma was noted.  ? ?Ocie Doyne, MD ?05/26/21 ?1:51 PM ? ?

## 2021-07-27 ENCOUNTER — Encounter: Payer: Self-pay | Admitting: Psychiatry

## 2021-08-16 ENCOUNTER — Telehealth: Payer: Self-pay | Admitting: Psychiatry

## 2021-08-16 NOTE — Telephone Encounter (Signed)
LVM and sent mychart msg informing pt of r/s needed for 8/21 appt- MD out.

## 2021-09-06 ENCOUNTER — Ambulatory Visit (INDEPENDENT_AMBULATORY_CARE_PROVIDER_SITE_OTHER): Payer: Medicare Other | Admitting: Psychiatry

## 2021-09-06 VITALS — BP 140/84 | HR 62 | Ht 64.0 in | Wt 183.1 lb

## 2021-09-06 DIAGNOSIS — M542 Cervicalgia: Secondary | ICD-10-CM | POA: Diagnosis not present

## 2021-09-06 DIAGNOSIS — M5481 Occipital neuralgia: Secondary | ICD-10-CM

## 2021-09-06 NOTE — Progress Notes (Signed)
Procedure: Occipital Nerve injection/Trigger point injection  Location: bilateral occiput  The risks, benefits and anticipated outcomes of the procedure, the risks and benefits of the alternatives to the procedure, and the roles and tasks of the personnel to be involved, were discussed with the patient, and the patient consents to the procedure and agrees to proceed.     A combination of 1 cc betamethasone 6 mg and 4 cc of 0.25% bupivacaine were prepared in 2 syringes (5 cc).  2 trigger points on the splenius capitus were identified and injected. The bilateral greater occipital nerves were injected 3cm caudal and 1.5 cm lateral to the inion where the main trunk of the occipital nerve penetrates the semispinalis muscle.  The needle was placed perpendicular and the needle advanced 1.5 cm. After aspiration to ensure no obstruction or presence of blood, the area was injected.  The needle was repositioned in a fan-like manner and the entire area was injected. Pressure was held and no hematoma was noted.   Ocie Doyne, MD 09/06/21 1:11 PM

## 2021-10-04 ENCOUNTER — Other Ambulatory Visit: Payer: Self-pay | Admitting: Psychiatry

## 2021-10-11 ENCOUNTER — Ambulatory Visit: Payer: Medicare Other | Admitting: Psychiatry

## 2021-11-30 ENCOUNTER — Other Ambulatory Visit: Payer: Self-pay | Admitting: Psychiatry

## 2021-12-02 ENCOUNTER — Encounter: Payer: Self-pay | Admitting: Psychiatry

## 2021-12-21 ENCOUNTER — Telehealth: Payer: Self-pay | Admitting: Psychiatry

## 2021-12-21 ENCOUNTER — Ambulatory Visit (INDEPENDENT_AMBULATORY_CARE_PROVIDER_SITE_OTHER): Payer: Medicare Other | Admitting: Psychiatry

## 2021-12-21 VITALS — BP 142/71 | HR 58 | Ht 64.0 in | Wt 183.0 lb

## 2021-12-21 DIAGNOSIS — M542 Cervicalgia: Secondary | ICD-10-CM | POA: Diagnosis not present

## 2021-12-21 DIAGNOSIS — R2689 Other abnormalities of gait and mobility: Secondary | ICD-10-CM

## 2021-12-21 MED ORDER — NARATRIPTAN HCL 2.5 MG PO TABS: 2.5000 mg | ORAL_TABLET | ORAL | 6 refills | Status: DC | PRN

## 2021-12-21 NOTE — Progress Notes (Addendum)
Procedure: Occipital Nerve injection/Trigger point injection  Location: bilateral occiput  The risks, benefits and anticipated outcomes of the procedure, the risks and benefits of the alternatives to the procedure, and the roles and tasks of the personnel to be involved, were discussed with the patient, and the patient consents to the procedure and agrees to proceed.     A combination of 1 cc betamethasone 6 mg and 4 cc of 0.25% bupivacaine were prepared in 1 syringe (5 cc).  2 trigger points on the splenius capitus were identified and injected. The left and right greater occipital nerves were injected 3cm caudal and 1.5 cm lateral to the inion where the main trunk of the occipital nerve penetrates the semispinalis muscle.  The needle was placed perpendicular and the needle advanced 1.5 cm. After aspiration to ensure no obstruction or presence of blood, the area was injected.  The needle was repositioned in a fan-like manner and the entire area was injected. Pressure was held and no hematoma was noted.   Prior Therapies                                  Topamax Lamictal Gabapentin - nausea/vomiting Propranolol Amlodipine Losartan 25 mg daily Aimovig Emgality Zoloft Doxepin 75-150 mg QHS Nurtec every other day - lack of efficacy Qulipta - dizziness Botox Flexeril - drowsiness Meclizine Dramamine Clonazepam Imitrex - lack of efficacy  Plan: -Continue Emgality 120 mg monthly -Start naratriptan 2.5 mg PRN for migraine rescue -Patient reports ongoing issues with vertigo and imbalance since the past year. Vertigo is only relieved by her chiropractor doing the Epley maneuver. She has been unable to go to physical therapy in the office due to transportation issues. Referral to home health placed for home PT  Genia Harold, MD 12/21/21

## 2021-12-21 NOTE — Telephone Encounter (Signed)
[facility] is going to take this patient starting next week.

## 2021-12-28 ENCOUNTER — Encounter: Payer: Self-pay | Admitting: Psychiatry

## 2021-12-28 NOTE — Telephone Encounter (Signed)
I addended my note. She's been having issues with vertigo and gait imbalance since the past year

## 2021-12-28 NOTE — Telephone Encounter (Signed)
Message from [facility]: Can Dr. Billey Gosling possibly addendum her note. The note from 10-31 is not really addressing why we need PT for the patient. Mainly talks about the procedure and then  mentions referral to [facility] for PT. We simply need more details as to what the need is for PT.   Thanks!

## 2022-01-10 NOTE — Telephone Encounter (Signed)
Robin Collier. P is calling from [facility]. Stated pt doesn't want PT services.

## 2022-01-10 NOTE — Telephone Encounter (Signed)
noted 

## 2022-01-12 ENCOUNTER — Telehealth: Payer: Self-pay | Admitting: Psychiatry

## 2022-01-12 NOTE — Telephone Encounter (Signed)
Called the patient back. She states that she is having a flare up of her vertigo and a migraine. She says the vertigo causing her to have difficulty with moving around and no control of limbs. She has ran out of her meclizine and that her daughter was going to pick it up today. I asked if she had taking her naratriptan and she states that she has taken one at 7 am advised that around 11 am if she still has a migraine she can take another naratriptan and see if that helps knock it out. Asked if she had taken zofran and she was unsure about that one. I advised that this looks like it is active medication on her list. Advised that she should try taking that and if she is allowed to take aleve or tylenol when she takes the next naratriptan dose she should take those with it and see if that helps. She seemed confused in the names of the medications. I asked would she like for me to call her daughter for her and relay this information and she states she did not want me to call her. I reiterated once more what she should try and take. She verbalized understanding. Advised the pt our office is closing early today and will be closed the rest of the week. Pt verbalized understanding and was appreciative for the call back.

## 2022-01-12 NOTE — Telephone Encounter (Signed)
Pt is calling. Stated she is having a migraine headache. Pt is requesting a call-back from nurse  stated she has question about medication. "Pt said I have took so many medicatio"'.

## 2022-02-23 ENCOUNTER — Telehealth: Payer: Self-pay | Admitting: Psychiatry

## 2022-02-23 NOTE — Telephone Encounter (Signed)
Pt's daughter, Wynonia Hazard (on [place]) pt having headaches and vertigo. Want to know if there is something else pt can try. Would like a call from the nurse.

## 2022-02-23 NOTE — Telephone Encounter (Signed)
Called the patient's daughter back. They felt for a bit she was having relief in regards to headaches but more recently there has been worsening in headaches and dizziness.  Pt states that despite using the meclizine for dizziness that is no longer helping when she has dizziness.  She had recently a bad episode last sat, sun and Monday having headache which caused vomiting. She started to feel better but woke up this morning with dizziness (no headache today) she ended up getting back in bed because it was so bad. The naratriptan is not helping. She is taking emgality once a month for headache prevention. Moved the patient's apt up wit Judson Roch but the daughter wanted to ask in the meantime if Dr Billey Gosling had any recommendation. At the last visit 10/31 pt had a trigger point injection. I questioned if she has tried PT for the dizziness. She states that she has in past but it wasn't helpful so was hesitant to try again. She also doesn't have transportation available to go to the outpatient therapy.

## 2022-02-24 MED ORDER — UBRELVY 100 MG PO TABS: 100.0000 mg | ORAL_TABLET | ORAL | 6 refills | Status: DC | PRN

## 2022-02-24 NOTE — Telephone Encounter (Signed)
I sent an rx for PRN Robin Collier to her pharmacy. We can try it and see if it helps. Looks like she was on a low dose of Ubrelvy only briefly. I sent in a higher dose than what she took previously

## 2022-02-24 NOTE — Telephone Encounter (Signed)
Called the daughter back and advised that Dr Billey Gosling reviewed our note. She will send in [place] for the patient to try at a higher dose. Advised we sent that to the pharmacy and likely will need a PA once approved the patient should try that to see if it helps. She was appreciative for the call

## 2022-03-10 ENCOUNTER — Encounter: Payer: Self-pay | Admitting: Neurology

## 2022-03-10 ENCOUNTER — Ambulatory Visit (INDEPENDENT_AMBULATORY_CARE_PROVIDER_SITE_OTHER): Payer: Medicare Other | Admitting: Neurology

## 2022-03-10 VITALS — BP 123/55 | HR 58

## 2022-03-10 DIAGNOSIS — G250 Essential tremor: Secondary | ICD-10-CM | POA: Diagnosis not present

## 2022-03-10 DIAGNOSIS — G43019 Migraine without aura, intractable, without status migrainosus: Secondary | ICD-10-CM | POA: Diagnosis not present

## 2022-03-10 MED ORDER — UBRELVY 100 MG PO TABS: 100.0000 mg | ORAL_TABLET | ORAL | 6 refills | Status: DC | PRN

## 2022-03-10 MED ORDER — EMGALITY 120 MG/ML ~~LOC~~ SOAJ: 1.0000 "pen " | SUBCUTANEOUS | 11 refills | Status: DC

## 2022-03-10 NOTE — Progress Notes (Signed)
Patient: Robin Collier Date of Birth: 01/25/1951  Reason for Visit: Follow up History from: Patient, daughter  Primary Neurologist: Robin Collier  ASSESSMENT AND PLAN 72 y.o. year old female   1.  History of migraine headaches 2.  Vertigo 3.  Essential tremor  -Continue Emgality for migraine preventative -Try Ubrelvy 100 mg as needed for acute headache, also has [place] -She declined home health physical therapy for vertigo and imbalance -Follow-up in 6 to 8 months or sooner if needed  Prior Therapies                                  Topamax Lamictal Gabapentin - nausea/vomiting Propranolol Amlodipine Losartan 25 mg daily Aimovig Zoloft Doxepin 75-150 mg QHS Nurtec every other day - lack of efficacy Qulipta - dizziness Botox Flexeril - drowsiness Occipital nerve block   HISTORY OF PRESENT ILLNESS: Today 03/10/22 Seen for occipital nerve block April, July, October 2023.  At last visit plan to continue Emgality, start naratriptan for rescue.  Referred to home health for PT, she declined the service.  Ubrelvy sent in, but hasn't pick it up. Reports nerve block in October didn't work. In the past claimed the botox didn't help at all, last was in October 2022. Always has some area in the head that aches. All are mild. Lives alone, doesn't eat until 4 PM. Has left knee pain, just got a gel injection. Mentions long history of spells of arms sporadically flailing.  HISTORY  05/17/21 Dr .Robin Collier Brief HPI: 72 year old female with a history of essential tremor who follows in clinic for migraine and vertigo.   At her last visit, Nurtec every other day was continued for prevention. Referral was placed to vestibular therapy.   Interval History: She did not notice improvement on Nurtec, so this was switched to [place] for prevention. Qulipta made her dizziness worse so she stopped it. She continues to have migraines every morning when she wakes up. They are associated with phonophobia  and nausea.   She continues to struggle with vertigo. She was unable to do vestibular therapy as she cannot make it to weekly appointments.   She presented to the ED 04/19/21 for headache and weakness. MRI brain was unremarkable.     Headache days per month: 30 Headache free days per month: 0     Prior Therapies                                  Topamax Lamictal Gabapentin - nausea/vomiting Propranolol Amlodipine Losartan 25 mg daily Aimovig Zoloft Doxepin 75-150 mg QHS Nurtec every other day - lack of efficacy Qulipta - dizziness Botox Flexeril - drowsiness  REVIEW OF SYSTEMS: Out of a complete 14 system review of symptoms, the patient complains only of the following symptoms, and all other reviewed systems are negative.  See HPI  ALLERGIES: Allergies  Allergen Reactions   Gabapentin Nausea And Vomiting and Other (See Comments)    Lightheadness   Hydrocodone Other (See Comments)    Dizziness    HOME MEDICATIONS: Outpatient Medications Prior to Visit  Medication Sig Dispense Refill   acetaminophen (TYLENOL) 500 MG tablet Take 1,000-1,500 mg by mouth 3 (three) times daily as needed for moderate pain.     clonazePAM (KLONOPIN) 0.5 MG tablet Take 0.5 mg by mouth 2 (two) times daily.  cyanocobalamin (,VITAMIN B-12,) 1000 MCG/ML injection Inject 1,000 mcg into the muscle every 30 (thirty) days.     lamoTRIgine (LAMICTAL) 200 MG tablet Take 200 mg by mouth 2 (two) times daily.      naratriptan (AMERGE) 2.5 MG tablet Take 1 tablet (2.5 mg total) by mouth as needed for migraine. Take one (1) tablet at onset of headache; if returns or does not resolve, may repeat after 4 hours; do not exceed five (5) mg in 24 hours. 10 tablet 6   ondansetron (ZOFRAN) 8 MG tablet Take 1 tablet (8 mg total) by mouth every 8 (eight) hours as needed for nausea or vomiting. 20 tablet 3   propranolol ER (INDERAL LA) 60 MG 24 hr capsule Take 60 mg by mouth daily.      ropinirole (REQUIP) 5 MG  tablet Take 4 mg by mouth daily. Been taking at night      sertraline (ZOLOFT) 100 MG tablet Take 100 mg by mouth daily.      traZODone (DESYREL) 100 MG tablet Take 300 mg by mouth at bedtime.      EMGALITY 120 MG/ML SOAJ INJECT 1 PEN. INTO THE SKIN EVERY 30 (THIRTY) DAYS. 1.12 mL 3   Ubrogepant (UBRELVY) 100 MG TABS Take 100 mg by mouth as needed (for vestibular migraine). May repeat a dose in 2 hours as needed. Max dose 2 pills in 24 hours 16 tablet 6   No facility-administered medications prior to visit.    PAST MEDICAL HISTORY: Past Medical History:  Diagnosis Date   Anxiety    Panic attacks   Arthritis    Bipolar disorder [facility])    one Dr says yes, one says no   Claustrophobia    Common migraine with intractable migraine 02/15/2017   Chronic daily headache   Constipation    Depression    Diplopia 02/15/2017   Diverticulitis    Fatty liver    Gait abnormality 02/15/2017   GERD (gastroesophageal reflux disease)    Headache(784.0)    migraines   Hypertension    Dr Robin Collier told me that I do not have high blood preazaure and I am not on medication   IBS (irritable bowel syndrome)    Lumbosacral spondylosis with radiculopathy    Mental disorder    Restless legs syndrome    Seizures ([place])    "not really seziure"   Tremor, essential 02/15/2017   Tremors of nervous system    Vertigo    06/06/16-  last 1. 5 years    PAST SURGICAL HISTORY: Past Surgical History:  Procedure Laterality Date   ABDOMINAL HYSTERECTOMY  '90   BACK SURGERY  2009   Discectomy   BLADDER SUSPENSION     COLONOSCOPY W/ POLYPECTOMY     EYE SURGERY Bilateral    Cataract   JOINT REPLACEMENT Right    Knee    RADIOLOGY WITH ANESTHESIA N/A 06/07/2016   Procedure: MRI LUMBAR SPINE WITHOUT CONTRAST;  Surgeon: Medication Radiologist, MD;  Location: [facility];  Service: Radiology;  Laterality: N/A;   RADIOLOGY WITH ANESTHESIA N/A 11/17/2016   Procedure: MRI OF BRAIN WITH AND WITHOUT CONTRAST;  Surgeon:  Radiologist, Medication, MD;  Location: [facility];  Service: Radiology;  Laterality: N/A;   RADIOLOGY WITH ANESTHESIA N/A 05/25/2017   Procedure: MRI WITH ANESTHESIA OF LUMBAR SPINE WITH AND WITHOUT;  Surgeon: Radiologist, Medication, MD;  Location: [facility];  Service: Radiology;  Laterality: N/A;   RETINAL DETACHMENT SURGERY Left 01/30/2018    FAMILY HISTORY:  Family History  Problem Relation Age of Onset   Cancer Mother    Cancer Father    Diabetes Father     SOCIAL HISTORY: Social History   Socioeconomic History   Marital status: Divorced    Spouse name: Not on file   Number of children: 2   Years of education: 12   Highest education level: Not on file  Occupational History   Occupation: Retired  Tobacco Use   Smoking status: Never   Smokeless tobacco: Never  Building services engineer Use: Never used  Substance and Sexual Activity   Alcohol use: No   Drug use: No   Sexual activity: Not Currently  Other Topics Concern   Not on file  Social History Narrative   Lives  alone   Caffeine use: coffee, coke daily   Right handed    Social Determinants of Health   Financial Resource Strain: Not on file  Food Insecurity: Not on file  Transportation Needs: Not on file  Physical Activity: Not on file  Stress: Not on file  Social Connections: Not on file  Intimate Partner Violence: Not on file   PHYSICAL EXAM  Vitals:   03/10/22 1532  BP: (!) 123/55  Pulse: (!) 58   There is no height or weight on file to calculate BMI.  Generalized: Well developed, in no acute distress  Neurological examination  Mentation: Alert oriented to time, place, history taking. Follows all commands Speech is slightly tremulous Cranial nerve II-XII: Pupils were equal round reactive to light. Extraocular movements were full, visual field were full on confrontational test. Facial sensation and strength were normal.  Head turning and shoulder shrug  were normal and symmetric. Motor: Good strength all  extremities, exception left 3/5 knee extension and flexion Sensory: Sensory testing is intact to soft touch on all 4 extremities. No evidence of extinction is noted.  Coordination: Cerebellar testing reveals good finger-nose-finger bilaterally mild tremor with finger-nose-finger Gait and station: In a wheelchair, was not ambulated today  DIAGNOSTIC DATA (LABS, IMAGING, TESTING) - I reviewed patient records, labs, notes, testing and imaging myself where available.  Lab Results  Component Value Date   WBC 6.4 04/19/2021   HGB 15.6 (H) 04/19/2021   HCT 46.0 04/19/2021   MCV 91.2 04/19/2021   PLT 361 04/19/2021      Component Value Date/Time   NA 141 04/19/2021 1538   K 3.1 (L) 04/19/2021 1538   CL 99 04/19/2021 1538   CO2 28 04/19/2021 1528   GLUCOSE 119 (H) 04/19/2021 1538   BUN 7 (L) 04/19/2021 1538   CREATININE 0.80 04/19/2021 1538   CALCIUM 9.3 04/19/2021 1528   PROT 6.8 04/19/2021 1528   ALBUMIN 3.8 04/19/2021 1528   AST 32 04/19/2021 1528   ALT 5 04/19/2021 1528   ALKPHOS 60 04/19/2021 1528   BILITOT 2.0 (H) 04/19/2021 1528   GFRNONAA >60 04/19/2021 1528   GFRAA >60 07/11/2017 0533   No results found for: "CHOL", "HDL", "LDLCALC", "LDLDIRECT", "TRIG", "CHOLHDL" No results found for: "HGBA1C" No results found for: "VITAMINB12" Lab Results  Component Value Date   TSH 1.730 02/15/2017    Margie Ege, AGNP-C, DNP 03/10/2022, 4:04 PM [facility] [address] [place], [place] [address] [phone]

## 2022-03-10 NOTE — Patient Instructions (Signed)
Continue the Emgality for migraine prevention  Use ubrelvy as needed for acute headache treatment See you back 6-8 months

## 2022-03-24 ENCOUNTER — Ambulatory Visit: Payer: Medicare Other | Admitting: Neurology

## 2022-03-29 ENCOUNTER — Telehealth: Payer: Self-pay | Admitting: Neurology

## 2022-03-29 NOTE — Telephone Encounter (Signed)
Pt's daughter Maudie Mercury on [place] called needing to speak to the RN or MD regarding her mother's Vertigo, Headache/Migraine slurred speech and fall she has had these last couple of days.  Daughter states that the medications are not working for her mother.

## 2022-10-17 ENCOUNTER — Ambulatory Visit (INDEPENDENT_AMBULATORY_CARE_PROVIDER_SITE_OTHER): Payer: Medicare Other | Admitting: Psychiatry

## 2022-10-17 VITALS — BP 130/67 | HR 52

## 2022-10-17 DIAGNOSIS — G43719 Chronic migraine without aura, intractable, without status migrainosus: Secondary | ICD-10-CM | POA: Diagnosis not present

## 2022-10-17 DIAGNOSIS — R251 Tremor, unspecified: Secondary | ICD-10-CM | POA: Diagnosis not present

## 2022-10-17 MED ORDER — PRIMIDONE 50 MG PO TABS: 50.0000 mg | ORAL_TABLET | Freq: Every day | ORAL | 6 refills | Status: AC

## 2022-10-17 NOTE — Progress Notes (Signed)
   CC:  headaches  Follow-up Visit  Last visit: 03/10/22  Brief HPI: 72 year old female with a history of essential tremor who follows in clinic for migraine, tremor, and vertigo.   At her last visit she was continued on Emgality for migraine prevention. She was started on Ubrelvy for rescue.  Interval History: Migraines have been well-controlled with Emgality and Ubrelvy. Sometimes takes Tylenol as well. She stopped taking naratriptan because Bernita Raisin works better.  She reports a recent sensation of internal tremors. States she will feel like she is vibrating on the inside but will not be shaking on the outside. She reports a long history of anxiety and takes Zoloft 200 mg daily. Feels this has become less effective over time. Discussed switching this with her Psychiatrist, but was recommended to continue her same anxiety regimen at this time. Her essential tremor has been bothersome lately and makes it difficult to write or hold cups. Continues to take propranolol 60 mg daily.  Current Headache Regimen: Preventative: Emgality 120 mg monthly Abortive: Ubrelvy 100 mg PRN   Prior Therapies                                  Prevention: Topamax Lamictal Gabapentin - nausea/vomiting Propranolol Amlodipine Losartan 25 mg daily Aimovig Emgality Zoloft Doxepin 75-150 mg QHS Nurtec every other day - lack of efficacy Qulipta - dizziness Botox  Resue: Sumatriptan Naratriptan Flexeril - drowsiness Nurtec Ubrelvy Occipital nerve block  Physical Exam:   Vital Signs: BP 130/67   Pulse (!) 52  GENERAL:  well appearing, in no acute distress, alert  SKIN:  Color, texture, turgor normal. No rashes or lesions HEAD:  Normocephalic/atraumatic. RESP: normal respiratory effort MSK:  No gross joint deformities.   NEUROLOGICAL: Mental Status: Alert, oriented to person, place and time, Follows commands, and Speech fluent and appropriate. Cranial Nerves: PERRL, face symmetric, no  dysarthria, hearing grossly intact Motor: moves all extremities equally. Action tremor present bilateral upper extremities. No bradykinesia or cogwheeling Gait: normal-based.  IMPRESSION: 72 year old female who presents for follow up of migraine, tremor, and vertigo. Her migraines have been well-controlled with [place] and [place]. She would like more tremor control if possible. Would avoid increasing propranolol as her heart rate is 52 at her current dose. Will add primidone for essential tremor. Suspect her uncontrolled anxiety is contributing to her sensation of internal tremors. Will also check TSH levels today.  PLAN: -TSH -Continue Emgality 120 mg monthly and Ubrelvy 100 mg PRN for migraines -Start primidone 50 mg at bedtime for essential tremor. Continue propranolol 60 mg daily  Follow-up: 6 months  I spent a total of 33 minutes on the date of the service. Headache education was done. Discussed treatment options including preventive and acute medications. Discussed medication side effects, adverse reactions and drug interactions. Written educational materials and patient instructions outlining all of the above were given.  Ocie Doyne, MD 10/17/22 3:30 PM

## 2022-10-18 LAB — TSH: TSH: 2.33 u[IU]/mL (ref 0.450–4.500)

## 2023-01-24 ENCOUNTER — Telehealth: Payer: Self-pay | Admitting: Neurology

## 2023-01-24 NOTE — Telephone Encounter (Signed)
LVM and sent mychart msg informing pt of need to reschedule 04/20/23 appt - NP out

## 2023-02-28 ENCOUNTER — Other Ambulatory Visit: Payer: Self-pay

## 2023-02-28 ENCOUNTER — Encounter: Payer: Self-pay | Admitting: Neurology

## 2023-03-30 ENCOUNTER — Other Ambulatory Visit: Payer: Self-pay | Admitting: Neurology

## 2023-04-20 ENCOUNTER — Ambulatory Visit: Payer: Medicare Other | Admitting: Neurology

## 2023-04-21 ENCOUNTER — Other Ambulatory Visit: Payer: Self-pay

## 2023-04-21 ENCOUNTER — Encounter: Payer: Self-pay | Admitting: Neurology

## 2023-04-21 MED ORDER — UBRELVY 100 MG PO TABS: 100.0000 mg | ORAL_TABLET | ORAL | 6 refills | Status: AC | PRN

## 2023-06-29 DIAGNOSIS — G43909 Migraine, unspecified, not intractable, without status migrainosus: Secondary | ICD-10-CM | POA: Diagnosis not present

## 2023-06-29 DIAGNOSIS — I498 Other specified cardiac arrhythmias: Secondary | ICD-10-CM | POA: Diagnosis not present

## 2023-06-29 DIAGNOSIS — I340 Nonrheumatic mitral (valve) insufficiency: Secondary | ICD-10-CM | POA: Diagnosis not present

## 2023-06-29 DIAGNOSIS — R1110 Vomiting, unspecified: Secondary | ICD-10-CM | POA: Diagnosis not present

## 2023-06-29 DIAGNOSIS — I491 Atrial premature depolarization: Secondary | ICD-10-CM | POA: Diagnosis not present

## 2023-06-29 DIAGNOSIS — I361 Nonrheumatic tricuspid (valve) insufficiency: Secondary | ICD-10-CM | POA: Diagnosis not present

## 2023-06-29 DIAGNOSIS — R001 Bradycardia, unspecified: Secondary | ICD-10-CM | POA: Diagnosis not present

## 2023-06-30 DIAGNOSIS — R001 Bradycardia, unspecified: Secondary | ICD-10-CM | POA: Diagnosis not present

## 2023-06-30 DIAGNOSIS — I340 Nonrheumatic mitral (valve) insufficiency: Secondary | ICD-10-CM | POA: Diagnosis not present

## 2023-07-01 DIAGNOSIS — I340 Nonrheumatic mitral (valve) insufficiency: Secondary | ICD-10-CM | POA: Diagnosis not present

## 2023-07-01 DIAGNOSIS — R001 Bradycardia, unspecified: Secondary | ICD-10-CM | POA: Diagnosis not present

## 2023-07-04 ENCOUNTER — Encounter: Payer: Self-pay | Admitting: Neurology

## 2023-07-05 ENCOUNTER — Telehealth (INDEPENDENT_AMBULATORY_CARE_PROVIDER_SITE_OTHER): Admitting: Neurology

## 2023-07-05 DIAGNOSIS — G250 Essential tremor: Secondary | ICD-10-CM | POA: Diagnosis not present

## 2023-07-05 DIAGNOSIS — R42 Dizziness and giddiness: Secondary | ICD-10-CM

## 2023-07-05 DIAGNOSIS — G43719 Chronic migraine without aura, intractable, without status migrainosus: Secondary | ICD-10-CM

## 2023-07-05 DIAGNOSIS — G43019 Migraine without aura, intractable, without status migrainosus: Secondary | ICD-10-CM

## 2023-07-05 NOTE — Progress Notes (Signed)
 Virtual Visit via Video Note  I connected with Robin Collier on 07/05/23 at  3:45 PM EDT by a video enabled telemedicine application and verified that I am speaking with the correct person using two identifiers.  Location: Patient: at her home  Provider: in the office    I discussed the limitations of evaluation and management by telemedicine and the availability of in person appointments. The patient expressed understanding and agreed to proceed.  Patient: Robin Collier Date of Birth: 1950-05-29  Reason for Visit: Follow up History from: Patient Primary Neurologist: Ch[facility]  ASSESSMENT AND PLAN 73 y.o. year old female   1.  Chronic migraine headache 2.  Essential tremor 3.  Vertigo  - Does not want to be on any preventative medication.  Will stop Emgality , no longer feels it is efficacious - Continue Ubrelvy  100 mg as needed for acute headache - Cautioned about rebound headache with medication overuse, recommend not treating headache more than 2 to 3 days a week -Propranolol  was recently stopped due to bradycardia, she is not taking primidone  prescribed from our office, her psych NP was managing  - Next steps: Vyepti -Leave next follow up appointment open, PCP can refill Ubrelvy , return if she wishes to pursue other migraine treatment options   -Prior Therapies                                  Prevention: Topamax  Lamictal  Gabapentin - nausea/vomiting Propranolol  Amlodipine Losartan 25 mg daily Aimovig  Emgality  Zoloft  Doxepin 75-150 mg QHS Nurtec every other day - lack of efficacy Qulipta  - dizziness Botox    Resue: Sumatriptan Naratriptan  Flexeril - drowsiness Nurtec Ubrelvy  Occipital nerve block     HISTORY OF PRESENT ILLNESS: Today 07/05/23 Last visit with Dr. Billy Bue, continue Emgality , Ubrelvy  for migraines.  Started primidone  50 mg at bedtime for essential tremor, continue propranolol  60 mg daily.  Patient's daughter sent MyChart message  yesterday reporting Emgality  no longer working, taking Ubrelvy  as needed.  Still complaining of daily headache.  Asking about magnesium?  Via VV, her last injection was last month. She didn't want to take it anymore because not sure how much it was helping. Reports headache every day, sometimes with vertigo. Hard to say exactly when she has severe migraine. Thinks her BP medication is making her feel a little strange. She would like to stop the Emgality , would like to be on just the Ubrelvy  PRN. Last week had to go the hospital, not feel well, her heart rate was low, her propranolol  was stopped. Put back on primidone .   HISTORY  10/17/22 Dr. Billy Bue Brief HPI: 73 year old female with a history of essential tremor who follows in clinic for migraine, tremor, and vertigo.    At her last visit she was continued on Emgality  for migraine prevention. She was started on Ubrelvy  for rescue.   Interval History: Migraines have been well-controlled with Emgality  and Ubrelvy . Sometimes takes Tylenol  as well. She stopped taking naratriptan  because Ubrelvy  works better.   She reports a recent sensation of internal tremors. States she will feel like she is vibrating on the inside but will not be shaking on the outside. She reports a long history of anxiety and takes Zoloft  200 mg daily. Feels this has become less effective over time. Discussed switching this with her Psychiatrist, but was recommended to continue her same anxiety regimen at this time. Her essential tremor has been  bothersome lately and makes it difficult to write or hold cups. Continues to take propranolol  60 mg daily.   Current Headache Regimen: Preventative: Emgality  120 mg monthly Abortive: Ubrelvy  100 mg PRN     Prior Therapies                                  Prevention: Topamax  Lamictal  Gabapentin - nausea/vomiting Propranolol  Amlodipine Losartan 25 mg daily Aimovig  Emgality  Zoloft  Doxepin 75-150 mg QHS Nurtec every other day -  lack of efficacy Qulipta  - dizziness Botox    Resue: Sumatriptan Naratriptan  Flexeril - drowsiness Nurtec Ubrelvy  Occipital nerve block  REVIEW OF SYSTEMS: Out of a complete 14 system review of symptoms, the patient complains only of the following symptoms, and all other reviewed systems are negative.  See HPI  ALLERGIES: Allergies  Allergen Reactions   Gabapentin Nausea And Vomiting and Other (See Comments)    Lightheadness   Hydrocodone  Other (See Comments)    Dizziness    HOME MEDICATIONS: Outpatient Medications Prior to Visit  Medication Sig Dispense Refill   acetaminophen  (TYLENOL ) 500 MG tablet Take 1,000-1,500 mg by mouth 3 (three) times daily as needed for moderate pain.     clonazePAM  (KLONOPIN ) 0.5 MG tablet Take 0.5 mg by mouth 2 (two) times daily.      cyanocobalamin (,VITAMIN B-12,) 1000 MCG/ML injection Inject 1,000 mcg into the muscle every 30 (thirty) days.     Galcanezumab -gnlm (EMGALITY ) 120 MG/ML SOAJ INJECT 1 PEN INTO THE SKIN EVERY 30 (THIRTY) DAYS. 1 mL 11   lamoTRIgine  (LAMICTAL ) 200 MG tablet Take 200 mg by mouth 2 (two) times daily.      ondansetron  (ZOFRAN ) 8 MG tablet Take 1 tablet (8 mg total) by mouth every 8 (eight) hours as needed for nausea or vomiting. 20 tablet 3   primidone  (MYSOLINE ) 50 MG tablet Take 1 tablet (50 mg total) by mouth at bedtime. 30 tablet 6   propranolol  ER (INDERAL  LA) 60 MG 24 hr capsule Take 60 mg by mouth daily.      ropinirole (REQUIP) 5 MG tablet Take 4 mg by mouth daily. Been taking at night      sertraline  (ZOLOFT ) 100 MG tablet Take 100 mg by mouth daily.      traZODone  (DESYREL ) 100 MG tablet Take 300 mg by mouth at bedtime.      Ubrogepant  (UBRELVY ) 100 MG TABS Take 1 tablet (100 mg total) by mouth as needed (for vestibular migraine). May repeat a dose in 2 hours as needed. Max dose 2 pills in 24 hours 16 tablet 6   No facility-administered medications prior to visit.    PAST MEDICAL HISTORY: Past Medical  History:  Diagnosis Date   Anxiety    Panic attacks   Arthritis    Bipolar disorder [facility])    one Dr says yes, one says no   Claustrophobia    Common migraine with intractable migraine 02/15/2017   Chronic daily headache   Constipation    Depression    Diplopia 02/15/2017   Diverticulitis    Fatty liver    Gait abnormality 02/15/2017   GERD (gastroesophageal reflux disease)    Headache(784.0)    migraines   Hypertension    Dr Eluterio Hamburg told me that I do not have high blood preazaure and I am not on medication   IBS (irritable bowel syndrome)    Lumbosacral spondylosis with radiculopathy    Mental  disorder    Restless legs syndrome    Seizures (HCC)    "not really seziure"   Tremor, essential 02/15/2017   Tremors of nervous system    Vertigo    06/06/16-  last 1. 5 years    PAST SURGICAL HISTORY: Past Surgical History:  Procedure Laterality Date   ABDOMINAL HYSTERECTOMY  '90   BACK SURGERY  2009   Discectomy   BLADDER SUSPENSION     COLONOSCOPY W/ POLYPECTOMY     EYE SURGERY Bilateral    Cataract   JOINT REPLACEMENT Right    Knee    RADIOLOGY WITH ANESTHESIA N/A 06/07/2016   Procedure: MRI LUMBAR SPINE WITHOUT CONTRAST;  Surgeon: Medication Radiologist, MD;  Location: [facility] OR;  Service: Radiology;  Laterality: N/A;   RADIOLOGY WITH ANESTHESIA N/A 11/17/2016   Procedure: MRI OF BRAIN WITH AND WITHOUT CONTRAST;  Surgeon: Radiologist, Medication, MD;  Location: [facility] OR;  Service: Radiology;  Laterality: N/A;   RADIOLOGY WITH ANESTHESIA N/A 05/25/2017   Procedure: MRI WITH ANESTHESIA OF LUMBAR SPINE WITH AND WITHOUT;  Surgeon: Radiologist, Medication, MD;  Location: [facility] OR;  Service: Radiology;  Laterality: N/A;   RETINAL DETACHMENT SURGERY Left 01/30/2018    FAMILY HISTORY: Family History  Problem Relation Age of Onset   Cancer Mother    Cancer Father    Diabetes Father     SOCIAL HISTORY: Social History   Socioeconomic History   Marital status: Divorced    Spouse  name: Not on file   Number of children: 2   Years of education: 12   Highest education level: Not on file  Occupational History   Occupation: Retired  Tobacco Use   Smoking status: Never   Smokeless tobacco: Never  Vaping Use   Vaping status: Never Used  Substance and Sexual Activity   Alcohol use: No   Drug use: No   Sexual activity: Not Currently  Other Topics Concern   Not on file  Social History Narrative   Lives  alone   Caffeine use: coffee, coke daily   Right handed    Social Drivers of Health   Financial Resource Strain: Not on file  Food Insecurity: Low Risk  (12/19/2022)   Received from [facility]   Hunger Vital Sign    Worried About Running Out of Food in the Last Year: Never true    Ran Out of Food in the Last Year: Never true  Transportation Needs: No Transportation Needs (12/19/2022)   Received from [facility]    In the past 12 months, has lack of reliable transportation kept you from medical appointments, meetings, work or from getting things needed for daily living? : No  Physical Activity: Not on file  Stress: Not on file  Social Connections: Not on file  Intimate Partner Violence: Not on file   PHYSICAL EXAM  There were no vitals filed for this visit. There is no height or weight on file to calculate BMI.  Generalized: Well developed, in no acute distress, in the bed. Her voice is tired, soft, doesn't seem to be feeling well. Daughter provides most history. She was not ambulated today. Daughter had difficulty with visual component of visit, I could not visualize her tremors.   DIAGNOSTIC DATA (LABS, IMAGING, TESTING) - I reviewed patient records, labs, notes, testing and imaging myself where available.  Lab Results  Component Value Date   WBC 6.4 04/19/2021   HGB 15.6 (H) 04/19/2021   HCT 46.0 04/19/2021  MCV 91.2 04/19/2021   PLT 361 04/19/2021      Component Value Date/Time   NA 141 04/19/2021 1538   K 3.1 (L)  04/19/2021 1538   CL 99 04/19/2021 1538   CO2 28 04/19/2021 1528   GLUCOSE 119 (H) 04/19/2021 1538   BUN 7 (L) 04/19/2021 1538   CREATININE 0.80 04/19/2021 1538   CALCIUM 9.3 04/19/2021 1528   PROT 6.8 04/19/2021 1528   ALBUMIN  3.8 04/19/2021 1528   AST 32 04/19/2021 1528   ALT 5 04/19/2021 1528   ALKPHOS 60 04/19/2021 1528   BILITOT 2.0 (H) 04/19/2021 1528   GFRNONAA >60 04/19/2021 1528   GFRAA >60 07/11/2017 0533   No results found for: "CHOL", "HDL", "LDLCALC", "LDLDIRECT", "TRIG", "CHOLHDL" No results found for: "HGBA1C" No results found for: "VITAMINB12" Lab Results  Component Value Date   TSH 2.330 10/17/2022    Jeanmarie Millet, AGNP-C, DNP 07/05/2023, 3:51 PM [facility] [address] [place], [place] [address] [phone]

## 2023-07-05 NOTE — Patient Instructions (Signed)
 You may continue Ubrelvy  as needed for acute migraine headache.  Please try not to treat headache more than 2 to 3 days a week with any medication as you can be at risk for rebound headache.  If you wish to pursue migraine treatment options please let us  know.  Your primary care doctor can refill Ubrelvy  going forward.  Please reach out for new symptoms.  Thanks

## 2023-08-17 ENCOUNTER — Telehealth: Payer: Self-pay | Admitting: Neurology

## 2023-08-17 NOTE — Telephone Encounter (Signed)
 Noted

## 2023-08-17 NOTE — Telephone Encounter (Signed)
 Patient stated seeing a different neurologist at another location

## 2023-08-24 ENCOUNTER — Ambulatory Visit: Payer: Medicare Other | Admitting: Neurology
